# Patient Record
Sex: Female | Born: 1950 | Race: White | Hispanic: No | Marital: Married | Smoking: Former smoker
Health system: Southern US, Community
[De-identification: ages and names within clinical notes are randomized; demographics above are authoritative.]

## PROBLEM LIST (undated history)

## (undated) DIAGNOSIS — E119 Type 2 diabetes mellitus without complications: Secondary | ICD-10-CM

## (undated) DIAGNOSIS — T148XXA Other injury of unspecified body region, initial encounter: Secondary | ICD-10-CM

## (undated) DIAGNOSIS — J3489 Other specified disorders of nose and nasal sinuses: Secondary | ICD-10-CM

## (undated) DIAGNOSIS — Z86010 Personal history of colonic polyps: Secondary | ICD-10-CM

## (undated) DIAGNOSIS — I10 Essential (primary) hypertension: Secondary | ICD-10-CM

## (undated) HISTORY — PX: COLONOSCOPY: SHX174

## (undated) HISTORY — DX: Personal history of colonic polyps: Z86.010

## (undated) HISTORY — PX: SHOULDER SURGERY: SHX246

---

## 1998-04-29 ENCOUNTER — Ambulatory Visit (HOSPITAL_COMMUNITY): Admission: RE | Admit: 1998-04-29 | Discharge: 1998-04-29 | Payer: Self-pay | Admitting: Endocrinology

## 2000-09-19 ENCOUNTER — Encounter: Admission: RE | Admit: 2000-09-19 | Discharge: 2000-12-18 | Payer: Self-pay | Admitting: Internal Medicine

## 2006-05-08 ENCOUNTER — Ambulatory Visit: Payer: Self-pay | Admitting: Internal Medicine

## 2006-05-23 ENCOUNTER — Ambulatory Visit: Payer: Self-pay | Admitting: Internal Medicine

## 2013-06-10 ENCOUNTER — Emergency Department (HOSPITAL_COMMUNITY): Payer: Managed Care, Other (non HMO)

## 2013-06-10 ENCOUNTER — Encounter (HOSPITAL_COMMUNITY): Payer: Self-pay | Admitting: *Deleted

## 2013-06-10 ENCOUNTER — Emergency Department (HOSPITAL_COMMUNITY)
Admission: EM | Admit: 2013-06-10 | Discharge: 2013-06-11 | Disposition: A | Discharge status: Home / Self Care | Payer: Managed Care, Other (non HMO) | Attending: Emergency Medicine | Admitting: Emergency Medicine

## 2013-06-10 DIAGNOSIS — Y9389 Activity, other specified: Secondary | ICD-10-CM | POA: Insufficient documentation

## 2013-06-10 DIAGNOSIS — S42209A Unspecified fracture of upper end of unspecified humerus, initial encounter for closed fracture: Secondary | ICD-10-CM | POA: Insufficient documentation

## 2013-06-10 DIAGNOSIS — I10 Essential (primary) hypertension: Secondary | ICD-10-CM | POA: Insufficient documentation

## 2013-06-10 DIAGNOSIS — Z87891 Personal history of nicotine dependence: Secondary | ICD-10-CM | POA: Insufficient documentation

## 2013-06-10 DIAGNOSIS — Z8639 Personal history of other endocrine, nutritional and metabolic disease: Secondary | ICD-10-CM | POA: Insufficient documentation

## 2013-06-10 DIAGNOSIS — S42202A Unspecified fracture of upper end of left humerus, initial encounter for closed fracture: Secondary | ICD-10-CM

## 2013-06-10 DIAGNOSIS — Z862 Personal history of diseases of the blood and blood-forming organs and certain disorders involving the immune mechanism: Secondary | ICD-10-CM | POA: Insufficient documentation

## 2013-06-10 DIAGNOSIS — W010XXA Fall on same level from slipping, tripping and stumbling without subsequent striking against object, initial encounter: Secondary | ICD-10-CM | POA: Insufficient documentation

## 2013-06-10 DIAGNOSIS — Y929 Unspecified place or not applicable: Secondary | ICD-10-CM | POA: Insufficient documentation

## 2013-06-10 DIAGNOSIS — E119 Type 2 diabetes mellitus without complications: Secondary | ICD-10-CM | POA: Insufficient documentation

## 2013-06-10 HISTORY — DX: Type 2 diabetes mellitus without complications: E11.9

## 2013-06-10 HISTORY — DX: Essential (primary) hypertension: I10

## 2013-06-10 NOTE — ED Notes (Signed)
Patient transported to X-ray 

## 2013-06-10 NOTE — ED Provider Notes (Signed)
CSN: [id number]     Arrival date & time 06/10/13  2332 History     First MD Initiated Contact with Patient 06/10/13 2334     Chief Complaint  Patient presents with  . Arm Pain    left   (Consider location/radiation/quality/duration/timing/severity/associated sxs/prior Treatment) HPI Comments: Patient presents to the ER by ambulance after a fall. Patient reports that she tripped over the cord of her treadmill, fell to gound. Severe pain in upper arm, improved after fentanyl by EMS. No loss of consiousness. Denies neck and back pain. Admits to drinking vodka tonight.  Patient is a 62 y.o. female presenting with arm pain.  Arm Pain Pertinent negatives include no chest pain, no headaches and no shortness of breath.    Past Medical History  Diagnosis Date  . Diabetes mellitus without complication   . Hypertension   . High cholesterol    History reviewed. No pertinent past surgical history. No family history on file. History  Substance Use Topics  . Smoking status: Former Games developer  . Smokeless tobacco: Never Used  . Alcohol Use: Yes   OB History   Grav Para Term Preterm Abortions TAB SAB Ect Mult Living                 Review of Systems  HENT: Negative for neck pain.   Respiratory: Negative for shortness of breath.   Cardiovascular: Negative for chest pain.  Musculoskeletal: Negative for back pain.  Neurological: Negative for dizziness, syncope and headaches.  All other systems reviewed and are negative.    Allergies  Review of patient's allergies indicates not on file.  Home Medications  No current outpatient prescriptions on file. BP 155/62  Pulse 68  Temp(Src) 97.7 F (36.5 C) (Oral)  Resp 18  SpO2 98% Physical Exam  Constitutional: She is oriented to person, place, and time. She appears well-developed and well-nourished. No distress.  HENT:  Head: Normocephalic and atraumatic.  Right Ear: Hearing normal.  Left Ear: Hearing normal.  Nose: Nose normal.   Mouth/Throat: Oropharynx is clear and moist and mucous membranes are normal.  Eyes: Conjunctivae and EOM are normal. Pupils are equal, round, and reactive to light.  Neck: Normal range of motion. Neck supple. No spinous process tenderness and no muscular tenderness present.  Cardiovascular: Regular rhythm, S1 normal and S2 normal.  Exam reveals no gallop and no friction rub.   No murmur heard. Pulses:      Radial pulses are 1+ on the left side.  Pulmonary/Chest: Effort normal and breath sounds normal. No respiratory distress. She exhibits no tenderness.  Abdominal: Soft. Normal appearance and bowel sounds are normal. There is no hepatosplenomegaly. There is no tenderness. There is no rebound, no guarding, no tenderness at McBurney's point and negative Murphy's sign. No hernia.  Musculoskeletal: Normal range of motion.       Left shoulder: Normal.       Left elbow: Normal.       Left wrist: Normal.       Cervical back: Normal.       Thoracic back: Normal.       Lumbar back: Normal.       Left upper arm: She exhibits tenderness.  Neurological: She is alert and oriented to person, place, and time. She has normal strength. No cranial nerve deficit or sensory deficit. Coordination normal. GCS eye subscore is 4. GCS verbal subscore is 5. GCS motor subscore is 6.  Skin: Skin is warm, dry and intact. No  rash noted. No cyanosis.  Psychiatric: She has a normal mood and affect. Her speech is normal and behavior is normal. Thought content normal.    ED Course   Procedures (including critical care time)  Labs Reviewed - No data to display Ct Head Wo Contrast  06/11/2013   *RADIOLOGY REPORT*  Clinical Data: Fall with head injury.  CT HEAD WITHOUT CONTRAST  Technique:  Contiguous axial images were obtained from the base of the skull through the vertex without contrast.  Comparison: None.  Findings: The ventricles and sulci are symmetrical without significant effacement, displacement, or dilatation. No  mass effect or midline shift. No abnormal extra-axial fluid collections. The grey-white matter junction is distinct. Basal cisterns are not effaced. No acute intracranial hemorrhage. No depressed skull fractures.  Vascular calcifications.  There is near complete opacification of the right maxillary antrum with thickening of maxillary antral walls suggesting chronic disease.  Paranasal sinuses are otherwise not opacified.  Mastoid air cells are not opacified.  IMPRESSION: No acute intracranial abnormalities.  Chronic-appearing changes in the left maxillary antrum.   Original Report Authenticated By: Burman Nieves, M.D.   Dg Humerus Left  06/11/2013   *RADIOLOGY REPORT*  Clinical Data: Left shoulder pain after fall.  LEFT HUMERUS - 2+ VIEW  Comparison: None.  Findings: The study is somewhat technically limited due to nonstandard views.  There appears to be acute displaced fracture of the left humeral neck with varus angulation.  Additional displaced bone fragments may be present.  The mid and distal humerus appear intact.  Degenerative changes at the acromioclavicular joint.  IMPRESSION: Technically limited study.  There is acute fracture with varus displacement of the left humeral neck.  There appear to be an additional displaced fracture fragments superiorly.   Original Report Authenticated By: Burman Nieves, M.D.   Diagnosis: Left proximal humerus fracture  MDM  Patient presents to the ER after a fall. Patient is complaining of left shoulder pain. Patient does not have any obvious deformity, but there is severe tenderness. She has decreased range of motion. X-ray confirms proximal humerus fracture. Patient admits to drinking alcohol tonight, but does not appear intoxicated. There was no loss of consciousness. She does have an abrasion on the left side of her head, however, and therefore CAT scan was performed to rule out endocranial injury. CAT scan shows no injury. Patient is experiencing neck or back  pain. The areas are nontender. No evidence of injury elsewhere. Patient provided analgesia the ER and will be discharged with analgesia, followup with orthopedics.  Gilda Crease, MD 06/11/13 [phone]

## 2013-06-10 NOTE — ED Notes (Signed)
Per EMS pt tripped over tread mill cord and fell today on L arm, complaining of L arm pain, limited range of motion, R hand 18G 50 mcg fentanyl given, ETOH on board, shots of vodka, BP 114/88, HR 76, RR 16, pt ambulated to restroom when arrived to ED w/o difficulty.

## 2013-06-10 NOTE — ED Notes (Signed)
Bed: WA08 Expected date:  Expected time:  Means of arrival:  Comments: EMS 

## 2013-06-11 MED ORDER — MORPHINE SULFATE 4 MG/ML IJ SOLN
4.0000 mg | Freq: Once | INTRAMUSCULAR | Status: AC
  Administered 2013-06-11: 4 mg via INTRAVENOUS
  Filled 2013-06-11: qty 1

## 2013-06-11 MED ORDER — ONDANSETRON HCL 4 MG/2ML IJ SOLN
4.0000 mg | Freq: Once | INTRAMUSCULAR | Status: AC
  Administered 2013-06-11: 4 mg via INTRAVENOUS
  Filled 2013-06-11: qty 2

## 2013-06-11 MED ORDER — OXYCODONE-ACETAMINOPHEN 5-325 MG PO TABS: 1.0000 | ORAL_TABLET | ORAL | Status: DC | PRN

## 2013-06-17 ENCOUNTER — Other Ambulatory Visit (HOSPITAL_COMMUNITY): Payer: Self-pay | Admitting: Orthopaedic Surgery

## 2013-06-18 ENCOUNTER — Encounter (HOSPITAL_COMMUNITY): Payer: Self-pay | Admitting: Pharmacy Technician

## 2013-06-19 ENCOUNTER — Encounter (HOSPITAL_COMMUNITY): Payer: Self-pay

## 2013-06-19 ENCOUNTER — Encounter (HOSPITAL_COMMUNITY)
Admission: RE | Admit: 2013-06-19 | Discharge: 2013-06-19 | Disposition: A | Discharge status: Home / Self Care | Payer: Managed Care, Other (non HMO) | Source: Ambulatory Visit | Attending: Orthopaedic Surgery | Admitting: Orthopaedic Surgery

## 2013-06-19 ENCOUNTER — Ambulatory Visit (HOSPITAL_COMMUNITY)
Admission: RE | Admit: 2013-06-19 | Discharge: 2013-06-19 | Disposition: A | Discharge status: Home / Self Care | Payer: Managed Care, Other (non HMO) | Source: Ambulatory Visit | Attending: Anesthesiology | Admitting: Anesthesiology

## 2013-06-19 DIAGNOSIS — X58XXXA Exposure to other specified factors, initial encounter: Secondary | ICD-10-CM | POA: Insufficient documentation

## 2013-06-19 DIAGNOSIS — S42209A Unspecified fracture of upper end of unspecified humerus, initial encounter for closed fracture: Secondary | ICD-10-CM | POA: Insufficient documentation

## 2013-06-19 DIAGNOSIS — I10 Essential (primary) hypertension: Secondary | ICD-10-CM | POA: Insufficient documentation

## 2013-06-19 DIAGNOSIS — Z01812 Encounter for preprocedural laboratory examination: Secondary | ICD-10-CM | POA: Insufficient documentation

## 2013-06-19 DIAGNOSIS — E119 Type 2 diabetes mellitus without complications: Secondary | ICD-10-CM | POA: Insufficient documentation

## 2013-06-19 DIAGNOSIS — Z01818 Encounter for other preprocedural examination: Secondary | ICD-10-CM | POA: Insufficient documentation

## 2013-06-19 DIAGNOSIS — Z01810 Encounter for preprocedural cardiovascular examination: Secondary | ICD-10-CM | POA: Insufficient documentation

## 2013-06-19 DIAGNOSIS — I700 Atherosclerosis of aorta: Secondary | ICD-10-CM | POA: Insufficient documentation

## 2013-06-19 HISTORY — DX: Other specified disorders of nose and nasal sinuses: J34.89

## 2013-06-19 HISTORY — DX: Other injury of unspecified body region, initial encounter: T14.8XXA

## 2013-06-19 LAB — CBC
HCT: 39.1 % (ref 36.0–46.0)
MCH: 33.6 pg (ref 26.0–34.0)
MCHC: 35 g/dL (ref 30.0–36.0)
MCV: 95.8 fL (ref 78.0–100.0)
Platelets: 268 10*3/uL (ref 150–400)
RDW: 12.1 % (ref 11.5–15.5)
WBC: 7.3 10*3/uL (ref 4.0–10.5)

## 2013-06-19 LAB — BASIC METABOLIC PANEL
Creatinine, Ser: 0.65 mg/dL (ref 0.50–1.10)
GFR calc Af Amer: >90 mL/min (ref >90)
Sodium: 139 mEq/L (ref 135–145)

## 2013-06-19 MED ORDER — CEFAZOLIN SODIUM-DEXTROSE 2-3 GM-% IV SOLR
2.0000 g | INTRAVENOUS | Status: AC
  Administered 2013-06-20: 2 g via INTRAVENOUS
  Filled 2013-06-19: qty 50

## 2013-06-19 NOTE — Pre-Procedure Instructions (Signed)
[name]  06/19/2013   Your procedure is scheduled on:  Thurs, Aug 28 @ 8:30 AM  Report to [facility] at 6:30 AM.  Call this number if you have problems the morning of surgery: [phone]   Remember:   Do not eat food or drink liquids after midnight.   Take these medicines the morning of surgery with A SIP OF WATER: Carvedilol(Coreg) and Pain Pill(if needed)              Stop taking your Ibuprofen.No Goody's,BC's,Aleve,Aspirin,Fish Oil,or any Herbal Medications   Do not wear jewelry, make-up or nail polish.  Do not wear lotions, powders, or perfumes. You may wear deodorant.  Do not shave 48 hours prior to surgery.   Do not bring valuables to the hospital.  [facility] is not responsible                   for any belongings or valuables.  Contacts, dentures or bridgework may not be worn into surgery.  Leave suitcase in the car. After surgery it may be brought to your room.  For patients admitted to the hospital, checkout time is 11:00 AM the day of  discharge.   Patients discharged the day of surgery will not be allowed to drive  home.    Special Instructions: Shower using CHG 2 nights before surgery and the night before surgery.  If you shower the day of surgery use CHG.  Use special wash - you have one bottle of CHG for all showers.  You should use approximately 1/3 of the bottle for each shower.   Please read over the following fact sheets that you were given: Pain Booklet, Coughing and Deep Breathing and Surgical Site Infection Prevention

## 2013-06-19 NOTE — Progress Notes (Signed)
06/19/13 1432  OBSTRUCTIVE SLEEP APNEA  Have you ever been diagnosed with sleep apnea through a sleep study? No  Do you snore loudly (loud enough to be heard through closed doors)?  0  Do you often feel tired, fatigued, or sleepy during the daytime? 0  Has anyone observed you stop breathing during your sleep? 0  Do you have, or are you being treated for high blood pressure? 1  BMI more than 35 kg/m2? 1  Age over 63 years old? 1  Neck circumference greater than 40 cm/18 inches? 1  Gender: 0  Obstructive Sleep Apnea Score 4

## 2013-06-19 NOTE — Progress Notes (Signed)
Pt doesn't have a cardiologist  Stress test done at least 43yrs ago  Denies ever having a heart cath or echo  Dr.Daniel Jarold Motto is Medical Md and Endocrinologist  Denies EKG or CXR in past yr

## 2013-06-20 ENCOUNTER — Ambulatory Visit (HOSPITAL_COMMUNITY): Payer: Managed Care, Other (non HMO) | Admitting: Anesthesiology

## 2013-06-20 ENCOUNTER — Other Ambulatory Visit: Payer: Self-pay | Admitting: Physician Assistant

## 2013-06-20 ENCOUNTER — Encounter (HOSPITAL_COMMUNITY)
Admission: RE | Disposition: A | Discharge status: Home / Self Care | Payer: Self-pay | Source: Ambulatory Visit | Attending: Orthopaedic Surgery

## 2013-06-20 ENCOUNTER — Ambulatory Visit (HOSPITAL_COMMUNITY): Payer: Managed Care, Other (non HMO)

## 2013-06-20 ENCOUNTER — Encounter (HOSPITAL_COMMUNITY): Payer: Self-pay | Admitting: Anesthesiology

## 2013-06-20 ENCOUNTER — Observation Stay (HOSPITAL_COMMUNITY)
Admission: RE | Admit: 2013-06-20 | Discharge: 2013-06-21 | Disposition: A | Discharge status: Home / Self Care | Payer: Managed Care, Other (non HMO) | Source: Ambulatory Visit | Attending: Orthopaedic Surgery | Admitting: Orthopaedic Surgery

## 2013-06-20 DIAGNOSIS — Y998 Other external cause status: Secondary | ICD-10-CM | POA: Insufficient documentation

## 2013-06-20 DIAGNOSIS — S42202A Unspecified fracture of upper end of left humerus, initial encounter for closed fracture: Secondary | ICD-10-CM

## 2013-06-20 DIAGNOSIS — W010XXA Fall on same level from slipping, tripping and stumbling without subsequent striking against object, initial encounter: Secondary | ICD-10-CM | POA: Insufficient documentation

## 2013-06-20 DIAGNOSIS — Z79899 Other long term (current) drug therapy: Secondary | ICD-10-CM | POA: Insufficient documentation

## 2013-06-20 DIAGNOSIS — E119 Type 2 diabetes mellitus without complications: Secondary | ICD-10-CM | POA: Insufficient documentation

## 2013-06-20 DIAGNOSIS — I10 Essential (primary) hypertension: Secondary | ICD-10-CM | POA: Insufficient documentation

## 2013-06-20 DIAGNOSIS — E7800 Pure hypercholesterolemia, unspecified: Secondary | ICD-10-CM | POA: Insufficient documentation

## 2013-06-20 DIAGNOSIS — S42213A Unspecified displaced fracture of surgical neck of unspecified humerus, initial encounter for closed fracture: Principal | ICD-10-CM | POA: Insufficient documentation

## 2013-06-20 DIAGNOSIS — Y92009 Unspecified place in unspecified non-institutional (private) residence as the place of occurrence of the external cause: Secondary | ICD-10-CM | POA: Insufficient documentation

## 2013-06-20 HISTORY — PX: ORIF HUMERUS FRACTURE: SHX2126

## 2013-06-20 LAB — GLUCOSE, CAPILLARY
Glucose-Capillary: 155 mg/dL — ABNORMAL HIGH (ref 70–99)
Glucose-Capillary: 276 mg/dL — ABNORMAL HIGH (ref 70–99)

## 2013-06-20 SURGERY — OPEN REDUCTION INTERNAL FIXATION (ORIF) PROXIMAL HUMERUS FRACTURE
Anesthesia: General | Site: Arm Upper | Laterality: Left | Wound class: Clean

## 2013-06-20 MED ORDER — METHOCARBAMOL 500 MG PO TABS
500.0000 mg | ORAL_TABLET | Freq: Four times a day (QID) | ORAL | Status: DC | PRN
  Administered 2013-06-21 (×2): 500 mg via ORAL
  Filled 2013-06-20 (×2): qty 1

## 2013-06-20 MED ORDER — FENTANYL CITRATE 0.05 MG/ML IJ SOLN
INTRAMUSCULAR | Status: AC
  Administered 2013-06-20: 100 ug via INTRAVENOUS
  Filled 2013-06-20: qty 2

## 2013-06-20 MED ORDER — HYDROCODONE-ACETAMINOPHEN 5-325 MG PO TABS: 1.0000 | ORAL_TABLET | ORAL | Status: AC | PRN

## 2013-06-20 MED ORDER — HYDROCHLOROTHIAZIDE 25 MG PO TABS
25.0000 mg | ORAL_TABLET | Freq: Every day | ORAL | Status: DC
  Filled 2013-06-20 (×2): qty 1

## 2013-06-20 MED ORDER — ROCURONIUM BROMIDE 100 MG/10ML IV SOLN
INTRAVENOUS | Status: DC | PRN
  Administered 2013-06-20: 50 mg via INTRAVENOUS

## 2013-06-20 MED ORDER — METHOCARBAMOL 100 MG/ML IJ SOLN
500.0000 mg | Freq: Four times a day (QID) | INTRAVENOUS | Status: DC | PRN
  Filled 2013-06-20: qty 5

## 2013-06-20 MED ORDER — DOCUSATE SODIUM 100 MG PO CAPS
100.0000 mg | ORAL_CAPSULE | Freq: Two times a day (BID) | ORAL | Status: DC
  Administered 2013-06-20 – 2013-06-21 (×2): 100 mg via ORAL
  Filled 2013-06-20 (×4): qty 1

## 2013-06-20 MED ORDER — BUPIVACAINE-EPINEPHRINE PF 0.5-1:200000 % IJ SOLN
INTRAMUSCULAR | Status: DC | PRN
  Administered 2013-06-20: 150 mg

## 2013-06-20 MED ORDER — CANAGLIFLOZIN 300 MG PO TABS
1.0000 | ORAL_TABLET | Freq: Every day | ORAL | Status: DC
  Administered 2013-06-21: 300 mg via ORAL
  Filled 2013-06-20 (×2): qty 1

## 2013-06-20 MED ORDER — POLYETHYLENE GLYCOL 3350 17 G PO PACK: 17.0000 g | PACK | Freq: Every day | ORAL | Status: DC | PRN

## 2013-06-20 MED ORDER — ONDANSETRON HCL 4 MG/2ML IJ SOLN: 4.0000 mg | Freq: Four times a day (QID) | INTRAMUSCULAR | Status: DC | PRN

## 2013-06-20 MED ORDER — ZOLPIDEM TARTRATE 5 MG PO TABS: 5.0000 mg | ORAL_TABLET | Freq: Every evening | ORAL | Status: DC | PRN

## 2013-06-20 MED ORDER — MIDAZOLAM HCL 2 MG/2ML IJ SOLN
2.0000 mg | Freq: Once | INTRAMUSCULAR | Status: AC
  Administered 2013-06-20: 2 mg via INTRAVENOUS

## 2013-06-20 MED ORDER — LACTATED RINGERS IV SOLN
INTRAVENOUS | Status: DC
  Administered 2013-06-20 (×3): via INTRAVENOUS

## 2013-06-20 MED ORDER — FENTANYL CITRATE 0.05 MG/ML IJ SOLN
INTRAMUSCULAR | Status: DC | PRN
  Administered 2013-06-20: 100 ug via INTRAVENOUS
  Administered 2013-06-20: 50 ug via INTRAVENOUS

## 2013-06-20 MED ORDER — BISACODYL 5 MG PO TBEC: 5.0000 mg | DELAYED_RELEASE_TABLET | Freq: Every day | ORAL | Status: DC | PRN

## 2013-06-20 MED ORDER — GLIMEPIRIDE 4 MG PO TABS
4.0000 mg | ORAL_TABLET | Freq: Every day | ORAL | Status: DC
  Administered 2013-06-21: 4 mg via ORAL
  Filled 2013-06-20 (×2): qty 1

## 2013-06-20 MED ORDER — OXYCODONE HCL 5 MG/5ML PO SOLN: 5.0000 mg | Freq: Once | ORAL | Status: DC | PRN

## 2013-06-20 MED ORDER — HYDROCODONE-ACETAMINOPHEN 5-325 MG PO TABS: 1.0000 | ORAL_TABLET | ORAL | Status: DC | PRN

## 2013-06-20 MED ORDER — MIDAZOLAM HCL 2 MG/2ML IJ SOLN
INTRAMUSCULAR | Status: AC
  Administered 2013-06-20: 2 mg via INTRAVENOUS
  Filled 2013-06-20: qty 2

## 2013-06-20 MED ORDER — ONDANSETRON HCL 4 MG/2ML IJ SOLN
INTRAMUSCULAR | Status: DC | PRN
  Administered 2013-06-20: 4 mg via INTRAVENOUS

## 2013-06-20 MED ORDER — EPHEDRINE SULFATE 50 MG/ML IJ SOLN
INTRAMUSCULAR | Status: DC | PRN
  Administered 2013-06-20 (×2): 10 mg via INTRAVENOUS

## 2013-06-20 MED ORDER — METFORMIN HCL ER 500 MG PO TB24
500.0000 mg | ORAL_TABLET | Freq: Two times a day (BID) | ORAL | Status: DC
  Administered 2013-06-20 – 2013-06-21 (×2): 500 mg via ORAL
  Filled 2013-06-20 (×4): qty 1

## 2013-06-20 MED ORDER — NEOSTIGMINE METHYLSULFATE 1 MG/ML IJ SOLN
INTRAMUSCULAR | Status: DC | PRN
  Administered 2013-06-20: 3 mg via INTRAVENOUS

## 2013-06-20 MED ORDER — PROMETHAZINE HCL 25 MG/ML IJ SOLN: 6.2500 mg | INTRAMUSCULAR | Status: DC | PRN

## 2013-06-20 MED ORDER — 0.9 % SODIUM CHLORIDE (POUR BTL) OPTIME
TOPICAL | Status: DC | PRN
  Administered 2013-06-20: 1000 mL

## 2013-06-20 MED ORDER — DEXAMETHASONE SODIUM PHOSPHATE 4 MG/ML IJ SOLN
INTRAMUSCULAR | Status: DC | PRN
  Administered 2013-06-20: 4 mg

## 2013-06-20 MED ORDER — PROPOFOL 10 MG/ML IV BOLUS
INTRAVENOUS | Status: DC | PRN
  Administered 2013-06-20: 200 mg via INTRAVENOUS

## 2013-06-20 MED ORDER — FENTANYL CITRATE 0.05 MG/ML IJ SOLN
100.0000 ug | Freq: Once | INTRAMUSCULAR | Status: AC
  Administered 2013-06-20: 100 ug via INTRAVENOUS

## 2013-06-20 MED ORDER — SODIUM CHLORIDE 0.9 % IV SOLN: INTRAVENOUS | Status: DC

## 2013-06-20 MED ORDER — CEFAZOLIN SODIUM 1-5 GM-% IV SOLN
1.0000 g | Freq: Four times a day (QID) | INTRAVENOUS | Status: DC
  Administered 2013-06-20 – 2013-06-21 (×2): 1 g via INTRAVENOUS
  Filled 2013-06-20 (×3): qty 50

## 2013-06-20 MED ORDER — OXYCODONE HCL 5 MG PO TABS: 5.0000 mg | ORAL_TABLET | Freq: Once | ORAL | Status: DC | PRN

## 2013-06-20 MED ORDER — GLYCOPYRROLATE 0.2 MG/ML IJ SOLN
INTRAMUSCULAR | Status: DC | PRN
  Administered 2013-06-20: 0.6 mg via INTRAVENOUS

## 2013-06-20 MED ORDER — METOCLOPRAMIDE HCL 10 MG PO TABS: 5.0000 mg | ORAL_TABLET | Freq: Three times a day (TID) | ORAL | Status: DC | PRN

## 2013-06-20 MED ORDER — SIMVASTATIN 20 MG PO TABS
20.0000 mg | ORAL_TABLET | ORAL | Status: DC
Start: 2013-06-21 — End: 2013-06-21
  Filled 2013-06-20: qty 1

## 2013-06-20 MED ORDER — RAMIPRIL 10 MG PO CAPS
10.0000 mg | ORAL_CAPSULE | Freq: Every day | ORAL | Status: DC
  Administered 2013-06-20: 10 mg via ORAL
  Filled 2013-06-20 (×2): qty 1

## 2013-06-20 MED ORDER — CARVEDILOL 12.5 MG PO TABS
12.5000 mg | ORAL_TABLET | Freq: Two times a day (BID) | ORAL | Status: DC
  Administered 2013-06-20 – 2013-06-21 (×2): 12.5 mg via ORAL
  Filled 2013-06-20 (×4): qty 1

## 2013-06-20 MED ORDER — OXYCODONE HCL 5 MG PO TABS
5.0000 mg | ORAL_TABLET | ORAL | Status: DC | PRN
  Administered 2013-06-21 (×3): 10 mg via ORAL
  Filled 2013-06-20 (×3): qty 2

## 2013-06-20 MED ORDER — MORPHINE SULFATE 2 MG/ML IJ SOLN: 1.0000 mg | INTRAMUSCULAR | Status: DC | PRN

## 2013-06-20 MED ORDER — METHOCARBAMOL 500 MG PO TABS: 500.0000 mg | ORAL_TABLET | Freq: Three times a day (TID) | ORAL | Status: AC

## 2013-06-20 MED ORDER — DIPHENHYDRAMINE HCL 12.5 MG/5ML PO ELIX: 12.5000 mg | ORAL_SOLUTION | ORAL | Status: DC | PRN

## 2013-06-20 MED ORDER — METOCLOPRAMIDE HCL 5 MG/ML IJ SOLN: 5.0000 mg | Freq: Three times a day (TID) | INTRAMUSCULAR | Status: DC | PRN

## 2013-06-20 MED ORDER — ONDANSETRON HCL 4 MG PO TABS: 4.0000 mg | ORAL_TABLET | Freq: Four times a day (QID) | ORAL | Status: DC | PRN

## 2013-06-20 MED ORDER — HYDROMORPHONE HCL PF 1 MG/ML IJ SOLN: 0.2500 mg | INTRAMUSCULAR | Status: DC | PRN

## 2013-06-20 SURGICAL SUPPLY — 71 items
ADH SKN CLS APL DERMABOND .7 (GAUZE/BANDAGES/DRESSINGS) ×1
BIT DRILL 2.8X4 QC CORT (BIT) ×1 IMPLANT
BIT DRILL 4 LONG FAST STEP (BIT) ×1 IMPLANT
BIT DRILL 4 SHORT FAST STEP (BIT) ×1 IMPLANT
BONE CANC CHIPS 40CC CAN1/2 (Bone Implant) ×2 IMPLANT
CANISTER SUCTION 2500CC (MISCELLANEOUS) ×1 IMPLANT
CHIPS CANC BONE 40CC CAN1/2 (Bone Implant) ×1 IMPLANT
CLOTH BEACON ORANGE TIMEOUT ST (SAFETY) ×2 IMPLANT
DERMABOND ADVANCED (GAUZE/BANDAGES/DRESSINGS) ×1
DERMABOND ADVANCED .7 DNX12 (GAUZE/BANDAGES/DRESSINGS) IMPLANT
DRAPE C-ARM 42X72 X-RAY (DRAPES) ×2 IMPLANT
DRAPE INCISE IOBAN 66X45 STRL (DRAPES) ×2 IMPLANT
DRAPE U-SHAPE 47X51 STRL (DRAPES) ×2 IMPLANT
DRSG AQUACEL AG ADV 3.5X10 (GAUZE/BANDAGES/DRESSINGS) ×1 IMPLANT
DRSG PAD ABDOMINAL 8X10 ST (GAUZE/BANDAGES/DRESSINGS) ×2 IMPLANT
ELECT REM PT RETURN 9FT ADLT (ELECTROSURGICAL) ×2
ELECTRODE REM PT RTRN 9FT ADLT (ELECTROSURGICAL) ×1 IMPLANT
GAUZE XEROFORM 1X8 LF (GAUZE/BANDAGES/DRESSINGS) ×1 IMPLANT
GLOVE BIO SURGEON STRL SZ8 (GLOVE) ×2 IMPLANT
GLOVE BIOGEL PI IND STRL 6.5 (GLOVE) IMPLANT
GLOVE BIOGEL PI IND STRL 8 (GLOVE) ×2 IMPLANT
GLOVE BIOGEL PI INDICATOR 6.5 (GLOVE) ×1
GLOVE BIOGEL PI INDICATOR 8 (GLOVE) ×3
GLOVE ECLIPSE 6.5 STRL STRAW (GLOVE) ×2 IMPLANT
GLOVE ORTHO TXT STRL SZ7.5 (GLOVE) ×3 IMPLANT
GOWN PREVENTION PLUS LG XLONG (DISPOSABLE) ×1 IMPLANT
GOWN PREVENTION PLUS XLARGE (GOWN DISPOSABLE) ×4 IMPLANT
GOWN STRL NON-REIN LRG LVL3 (GOWN DISPOSABLE) ×2 IMPLANT
GRAFT BNE CHIP CANC 1-8 40 (Bone Implant) IMPLANT
KIT BASIN OR (CUSTOM PROCEDURE TRAY) ×2 IMPLANT
KIT ROOM TURNOVER OR (KITS) ×2 IMPLANT
MANIFOLD NEPTUNE II (INSTRUMENTS) ×2 IMPLANT
NEEDLE 22X1 1/2 (OR ONLY) (NEEDLE) ×1 IMPLANT
NS IRRIG 1000ML POUR BTL (IV SOLUTION) ×2 IMPLANT
PACK SHOULDER (CUSTOM PROCEDURE TRAY) ×2 IMPLANT
PAD ARMBOARD 7.5X6 YLW CONV (MISCELLANEOUS) ×4 IMPLANT
PEG STND 4.0X32.5MM (Orthopedic Implant) ×2 IMPLANT
PEG STND 4.0X37.5MM (Orthopedic Implant) ×2 IMPLANT
PEG STND 4.0X40MM (Orthopedic Implant) ×2 IMPLANT
PEG STND 4.0X42.5MM (Orthopedic Implant) ×4 IMPLANT
PEG STND 4.0X45.0MM (Orthopedic Implant) ×4 IMPLANT
PEGSTD 4.0X32.5MM (Orthopedic Implant) IMPLANT
PEGSTD 4.0X37.5MM (Orthopedic Implant) IMPLANT
PEGSTD 4.0X40MM (Orthopedic Implant) IMPLANT
PEGSTD 4.0X42.5MM (Orthopedic Implant) IMPLANT
PEGSTD 4.0X45.0MM (Orthopedic Implant) IMPLANT
PIN GUIDE SHOULDER 2.0MM (PIN) ×3 IMPLANT
PLATE SHOULDER S3 4HOLE LT (Plate) ×1 IMPLANT
SCREW MULTIDIR 3.8X30 HUMRL (Screw) ×1 IMPLANT
SCREW MULTIDIR 3.8X32 HUMRL (Screw) ×1 IMPLANT
SCREW MULTIDIR SS 3.8X28 HUMRL (Screw) ×1 IMPLANT
SLING ARM FOAM STRAP LRG (SOFTGOODS) ×1 IMPLANT
SPONGE GAUZE 4X4 12PLY (GAUZE/BANDAGES/DRESSINGS) ×1 IMPLANT
SPONGE LAP 4X18 X RAY DECT (DISPOSABLE) ×3 IMPLANT
STAPLER VISISTAT 35W (STAPLE) ×2 IMPLANT
SUCTION FRAZIER TIP 10 FR DISP (SUCTIONS) ×2 IMPLANT
SUT FIBERWIRE #2 38 T-5 BLUE (SUTURE) ×2
SUT MNCRL AB 4-0 PS2 18 (SUTURE) ×1 IMPLANT
SUT VIC AB 0 CT1 27 (SUTURE) ×4
SUT VIC AB 0 CT1 27XBRD ANBCTR (SUTURE) ×2 IMPLANT
SUT VIC AB 1 CT1 27 (SUTURE) ×2
SUT VIC AB 1 CT1 27XBRD ANBCTR (SUTURE) IMPLANT
SUT VIC AB 2-0 CT1 27 (SUTURE) ×4
SUT VIC AB 2-0 CT1 TAPERPNT 27 (SUTURE) ×2 IMPLANT
SUTURE FIBERWR #2 38 T-5 BLUE (SUTURE) IMPLANT
SYR CONTROL 10ML LL (SYRINGE) ×1 IMPLANT
TOWEL OR 17X24 6PK STRL BLUE (TOWEL DISPOSABLE) ×2 IMPLANT
TOWEL OR 17X26 10 PK STRL BLUE (TOWEL DISPOSABLE) ×2 IMPLANT
TUBE CONNECTING 12X1/4 (SUCTIONS) ×1 IMPLANT
WATER STERILE IRR 1000ML POUR (IV SOLUTION) ×1 IMPLANT
YANKAUER SUCT BULB TIP NO VENT (SUCTIONS) ×3 IMPLANT

## 2013-06-20 NOTE — Anesthesia Preprocedure Evaluation (Signed)
Anesthesia Evaluation  Patient identified by MRN, date of birth, ID band Patient awake    Reviewed: Allergy & Precautions, H&P , NPO status , Patient's Chart, lab work & pertinent test results  History of Anesthesia Complications Negative for: history of anesthetic complications  Airway Mallampati: II TM Distance: >3 FB Neck ROM: Full    Dental  (+) Teeth Intact and Dental Advisory Given   Pulmonary neg pulmonary ROS, former smoker,    Pulmonary exam normal       Cardiovascular hypertension, Pt. on medications     Neuro/Psych negative neurological ROS  negative psych ROS   GI/Hepatic negative GI ROS, Neg liver ROS,   Endo/Other  diabetes  Renal/GU negative Renal ROS     Musculoskeletal   Abdominal   Peds  Hematology   Anesthesia Other Findings   Reproductive/Obstetrics                           Anesthesia Physical Anesthesia Plan  ASA: III  Anesthesia Plan: General   Post-op Pain Management:    Induction: Intravenous  Airway Management Planned: Oral ETT  Additional Equipment:   Intra-op Plan:   Post-operative Plan: Extubation in OR  Informed Consent: I have reviewed the patients History and Physical, chart, labs and discussed the procedure including the risks, benefits and alternatives for the proposed anesthesia with the patient or authorized representative who has indicated his/her understanding and acceptance.   Dental advisory given  Plan Discussed with: CRNA, Anesthesiologist and Surgeon  Anesthesia Plan Comments:         Anesthesia Quick Evaluation

## 2013-06-20 NOTE — Progress Notes (Signed)
assited pt up to br voided qs

## 2013-06-20 NOTE — H&P (Signed)
[name] is an 62 y.o. female.   Chief Complaint:   Left shoulder pain; known proximal humerus fracture HPI:   62 yo female who sustained a severely displaced left proximal humerus frature from a mechanical fall last week.  It has been recommended that she undergo ORIF of this fracture due to its severe displacement and rotation.  She understands this fully as well as the risks involved with surgery.  Past Medical History  Diagnosis Date  . High cholesterol     takes Zocor every other day  . Bruise     on left arm from break  . Hypertension     takes Ramipril,HCTZ, and COreg daily  . Diabetes mellitus without complication     takes Amaryl,Metformin,and Invokana daily  . Sinus drainage     Past Surgical History  Procedure Laterality Date  . Colonoscopy    . No past surgeries      History reviewed. No pertinent family history. Social History:  reports that she has quit smoking. She has never used smokeless tobacco. She reports that  drinks alcohol. She reports that she does not use illicit drugs.  Allergies:  Allergies  Allergen Reactions  . Sulfa Antibiotics Other (See Comments)    Yeast infections     Medications Prior to Admission  Medication Sig Dispense Refill  . Canagliflozin (INVOKANA) 300 MG TABS Take 1 tablet by mouth daily.      . carvedilol (COREG) 12.5 MG tablet Take 12.5 mg by mouth 2 (two) times daily.      Marland Kitchen glimepiride (AMARYL) 4 MG tablet Take 4 mg by mouth daily before breakfast.      . hydrochlorothiazide (HYDRODIURIL) 25 MG tablet Take 25 mg by mouth daily.      Marland Kitchen ibuprofen (ADVIL,MOTRIN) 200 MG tablet Take 200 mg by mouth every 8 (eight) hours as needed for pain.      . metFORMIN (GLUCOPHAGE-XR) 500 MG 24 hr tablet Take 500 mg by mouth 2 (two) times daily.      Marland Kitchen oxyCODONE-acetaminophen (PERCOCET/ROXICET) 5-325 MG per tablet Take 1 tablet by mouth every 4 (four) hours as needed for pain.      . ramipril (ALTACE) 10 MG capsule Take 10 mg by mouth  daily.      . simvastatin (ZOCOR) 20 MG tablet Take 20 mg by mouth every other day.         Results for orders placed during the hospital encounter of 06/20/13 (from the past 48 hour(s))  GLUCOSE, CAPILLARY     Status: Abnormal   Collection Time    06/20/13  7:04 AM      Result Value Range   Glucose-Capillary 155 (*) 70 - 99 mg/dL   Dg Chest 2 View  6/96/2952   *RADIOLOGY REPORT*  Clinical Data: Preoperative evaluation, left humeral fracture, history hypertension, diabetes, smoking  CHEST - 2 VIEW  Comparison: None  Findings: Upper-normal size of cardiac silhouette. Atherosclerotic calcification aorta. Mediastinal contours and pulmonary vascularity normal. Subsegmental atelectasis at medial left lung base. Remaining lungs clear. No pleural effusion or pneumothorax. Comminuted displaced fracture-dislocation of the proximal left humerus.  IMPRESSION: Subsegmental atelectasis medial left lower lobe. Comminuted displaced fracture-dislocation of the proximal left humerus.   Original Report Authenticated By: Ulyses Southward, M.D.    Review of Systems  All other systems reviewed and are negative.    Blood pressure 146/66, pulse 69, resp. rate 16, SpO2 99.00%. Physical Exam  Constitutional: She is oriented to person, place,  and time. She appears well-developed and well-nourished.  HENT:  Head: Normocephalic and atraumatic.  Eyes: EOM are normal. Pupils are equal, round, and reactive to light.  Neck: Normal range of motion. Neck supple.  Cardiovascular: Normal rate and regular rhythm.   Respiratory: Effort normal and breath sounds normal.  GI: Soft. Bowel sounds are normal.  Musculoskeletal:       Left shoulder: She exhibits decreased range of motion, bony tenderness, swelling, deformity and pain.  Neurological: She is alert and oriented to person, place, and time.  Skin: Skin is warm and dry.  Psychiatric: She has a normal mood and affect.     Assessment/Plan Significantly displaced left 3  - 4 part proximal humerus fracture 1)  To the OR today for open reduction/internal fixation of her left proximal humerus fracture then admission for overnight observation  [name] Y 06/20/2013, 8:25 AM

## 2013-06-20 NOTE — Anesthesia Procedure Notes (Addendum)
Anesthesia Regional Block:  Interscalene brachial plexus block  Pre-Anesthetic Checklist: ,, timeout performed, Correct Patient, Correct Site, Correct Laterality, Correct Procedure, Correct Position, site marked, Risks and benefits discussed,  Surgical consent,  Pre-op evaluation,  At surgeon's request and post-op pain management  Laterality: Right  Prep: chloraprep       Needles:  Injection technique: Single-shot  Needle Type: Echogenic Stimulator Needle     Needle Length: 5cm 5 cm Needle Gauge: 22 and 22 G    Additional Needles:  Procedures: ultrasound guided (picture in chart) and nerve stimulator Interscalene brachial plexus block  Nerve Stimulator or Paresthesia:  Response: bicep contraction, 0.45 mA,   Additional Responses:   Narrative:  Start time: 06/20/2013 8:16 AM End time: 06/20/2013 8:26 AM Injection made incrementally with aspirations every 5 mL.  Performed by: Personally  Anesthesiologist: J. Adonis Huguenin, MD  Additional Notes: Functioning IV was confirmed and monitors applied.  A 50mm 22ga echogenic arrow stimulator was used. Sterile prep and drape,hand hygiene and sterile gloves were used.Ultrasound guidance: relevant anatomy identified, needle position confirmed, local anesthetic spread visualized around nerve(s)., vascular puncture avoided.  Image printed for medical record.  Negative aspiration and negative test dose prior to incremental administration of local anesthetic. The patient tolerated the procedure well.  Interscalene brachial plexus block

## 2013-06-20 NOTE — Transfer of Care (Signed)
Immediate Anesthesia Transfer of Care Note  Patient: [name]  Procedure(s) Performed: Procedure(s): OPEN REDUCTION INTERNAL FIXATION (ORIF) LEFT PROXIMAL HUMERUS FRACTURE (Left)  Patient Location: PACU  Anesthesia Type:General and Regional  Level of Consciousness: awake, alert , oriented and patient cooperative  Airway & Oxygen Therapy: Patient Spontanous Breathing and Patient connected to nasal cannula oxygen  Post-op Assessment: Report given to PACU RN, Post -op Vital signs reviewed and stable and Patient moving all extremities  Post vital signs: Reviewed and stable  Complications: No apparent anesthesia complications

## 2013-06-20 NOTE — Op Note (Signed)
[name], [name] NO.:  [id number]  MEDICAL RECORD NO.:  [id number]  LOCATION:  [id number]                        FACILITY:  [facility]  PHYSICIAN:  Vanita Panda. Magnus Ivan, M.D.DATE OF BIRTH:  02-Aug-1951  DATE OF PROCEDURE:  06/20/2013 DATE OF DISCHARGE:                              OPERATIVE REPORT   PREOPERATIVE DIAGNOSIS:  Left shoulder 3-4 part displaced proximal humerus fracture.  POSTOPERATIVE DIAGNOSIS:  Left 4-part proximal humerus fracture.  PROCEDURE:  Open reduction and internal fixation of left 4-part proximal humerus fracture.  IMPLANTS:  [employer] periarticular proximal humeral locking plate.  SURGEON:  Vanita Panda. Magnus Ivan, M.D.  ASSISTANT:  Richardean Canal, P.A.  ANESTHESIA: 1. Regional left shoulder block. 2. General.  BLOOD LOSS:  Less than 100 mL.  COMPLICATIONS:  None.  ANTIBIOTICS:  2 g of IV Ancef.  INDICATIONS:  Ms. [name] is a very pleasant 62 year old female who last week tripped over a cord in her house sustaining a significant fall injuring her left shoulder.  She was seen in the emergency room and found to have a complex proximal humerus fracture.  We then saw her in the office and due to the nature of this fracture, recommended open reduction and internal fixation.  She understands how significant this break is and understands the reasoning for [place] to proceed with open reduction and internal fixation.  PROCEDURE DESCRIPTION:  After informed consent was obtained, the appropriate left shoulder was marked.  Anesthesia was obtained with the regional shoulder block.  She was then brought to the operating room, placed supine on the operating table.  General anesthesia was then obtained.  She was then fashioned in a beach-chair position with appropriate position of the head and neck and padding of the down on operative right arm.  There was bending at the waist and knees and palpable pulses in her feet.  Her left  shoulder was then prepped and draped with DuraPrep and sterile drapes including sterile stockinette. A time-out was called and she was identified as correct patient, correct left shoulder.  I then took a deltopectoral approach to the shoulder dissecting down to the fracture itself.  I cleaned the fracture of debris and then saw the articular surface of the femoral head was spun about 180 degrees.  Once I pointed this back in the direction of the glenoid, I was able to bring the lesser tuberosity and greater tuberosity pieces back around.  I secured these with temporary K-wires. I then chose a periarticular lock and plate from [employer] and fashioned this along the anterolateral cortex of the humerus.  I secured this with bicortical screws distally and multiple locking pegs proximally.  I before bringing all the lesser and greater trochanter pieces back together, I did have to use supplemental bone graft with cancellous chips to fill out void in the humeral head.  I then brought the FiberWire suture down to the greater and lesser trochanter pieces and through the plate to secure these down.  All the reduction was accomplished under direct visualization and direct fluoroscopy.  We then irrigated the soft tissues with normal saline solution.  I closed the deep tissue with 0-Vicryl, 2-0 Vicryl in subcutaneous  tissue, 4-0 Monocryl subcuticular stitch and Dermabond on the skin.  Well-padded sterile dressing was applied.  Arm was placed in a sling.  She was awakened, extubated, and taken to the recovery room in stable condition. All final counts were correct.  There were no complications noted.  Of note, Kriste Basque, PA-C was present in the entire case and his presence was most crucial for positioning the arm, retracting, and closing the wound.     Vanita Panda. Magnus Ivan, M.D.     CYB/MEDQ  D:  06/20/2013  T:  06/20/2013  Job:  [id number]

## 2013-06-20 NOTE — Anesthesia Postprocedure Evaluation (Signed)
Anesthesia Post Note  Patient: [name]  Procedure(s) Performed: Procedure(s) (LRB): OPEN REDUCTION INTERNAL FIXATION (ORIF) LEFT PROXIMAL HUMERUS FRACTURE (Left)  Anesthesia type: general  Patient location: PACU  Post pain: Pain level controlled  Post assessment: Patient's Cardiovascular Status Stable  Last Vitals:  Filed Vitals:   06/20/13 1142  BP: 117/73  Pulse: 57  Temp:   Resp: 17    Post vital signs: Reviewed and stable  Level of consciousness: sedated  Complications: No apparent anesthesia complications

## 2013-06-20 NOTE — Brief Op Note (Signed)
06/20/2013  10:57 AM  PATIENT:  [name]  62 y.o. female  PRE-OPERATIVE DIAGNOSIS:  Left proximal humerus fracture  POST-OPERATIVE DIAGNOSIS:  Left proximal humerus fracture  PROCEDURE:  Procedure(s): OPEN REDUCTION INTERNAL FIXATION (ORIF) LEFT PROXIMAL HUMERUS FRACTURE (Left)  SURGEON:  Surgeon(s) and Role:    * Kathryne Hitch, MD - Primary  PHYSICIAN ASSISTANT: Rexene Edison, PA-C  ANESTHESIA:   regional and general  EBL:  Total I/O In: -  Out: 100 [Blood:100]  BLOOD ADMINISTERED:none  DRAINS: none   LOCAL MEDICATIONS USED:  NONE  SPECIMEN:  No Specimen  DISPOSITION OF SPECIMEN:  N/A  COUNTS:  YES  TOURNIQUET:  * No tourniquets in log *  DICTATION: .Other Dictation: Dictation Number [id number]  PLAN OF CARE: Admit for overnight observation  PATIENT DISPOSITION:  PACU - hemodynamically stable.   Delay start of Pharmacological VTE agent (>24hrs) due to surgical blood loss or risk of bleeding: not applicable

## 2013-06-21 ENCOUNTER — Encounter (HOSPITAL_COMMUNITY): Payer: Self-pay | Admitting: Orthopaedic Surgery

## 2013-06-21 LAB — GLUCOSE, CAPILLARY: Glucose-Capillary: 179 mg/dL — ABNORMAL HIGH (ref 70–99)

## 2013-06-21 NOTE — Discharge Summary (Signed)
Patient ID: [name] MRN: [id number] DOB/AGE: 06-26-1951 62 y.o.  Admit date: 06/20/2013 Discharge date: 06/21/2013  Admission Diagnoses:  Principal Problem:   Closed fracture of left proximal humerus   Discharge Diagnoses:  Same  Past Medical History  Diagnosis Date  . High cholesterol     takes Zocor every other day  . Bruise     on left arm from break  . Hypertension     takes Ramipril,HCTZ, and COreg daily  . Diabetes mellitus without complication     takes Amaryl,Metformin,and Invokana daily  . Sinus drainage     Surgeries: Procedure(s): OPEN REDUCTION INTERNAL FIXATION (ORIF) LEFT PROXIMAL HUMERUS FRACTURE on 06/20/2013   Consultants:    Discharged Condition: Improved  Hospital Course: [name] is an 62 y.o. female who was admitted 06/20/2013 for operative treatment ofClosed fracture of left proximal humerus. Patient has severe unremitting pain that affects sleep, daily activities, and work/hobbies. After pre-op clearance the patient was taken to the operating room on 06/20/2013 and underwent  Procedure(s): OPEN REDUCTION INTERNAL FIXATION (ORIF) LEFT PROXIMAL HUMERUS FRACTURE.    Patient was given perioperative antibiotics: Anti-infectives   Start     Dose/Rate Route Frequency Ordered Stop   06/20/13 1730  ceFAZolin (ANCEF) IVPB 1 g/50 mL premix     1 g 100 mL/hr over 30 Minutes Intravenous Every 6 hours 06/20/13 1628 06/21/13 1129   06/20/13 0600  ceFAZolin (ANCEF) IVPB 2 g/50 mL premix     2 g 100 mL/hr over 30 Minutes Intravenous On call to O.R. 06/19/13 1424 06/20/13 0905       Patient was given sequential compression devices, early ambulation, and chemoprophylaxis to prevent DVT.  Patient benefited maximally from hospital stay and there were no complications.    Recent vital signs: Patient Vitals for the past 24 hrs:  BP Temp Temp src Pulse Resp SpO2  06/21/13 0637 130/65 mmHg 97.6 F (36.4 C) - 72 20 99 %  06/21/13 0215 125/63 mmHg 97.3 F  (36.3 C) Axillary 73 20 98 %  06/20/13 2106 138/63 mmHg 98 F (36.7 C) - 83 20 98 %  06/20/13 1610 142/56 mmHg 98.1 F (36.7 C) Oral 66 18 97 %  06/20/13 1545 146/60 mmHg 98 F (36.7 C) - 89 21 96 %  06/20/13 1442 147/42 mmHg - - 73 18 97 %  06/20/13 1345 - - - 70 16 97 %  06/20/13 1341 137/64 mmHg - - - - -  06/20/13 1315 - - - 63 21 95 %  06/20/13 1311 114/66 mmHg - - - - -  06/20/13 1245 - - - 55 17 98 %  06/20/13 1242 112/77 mmHg - - - - -  06/20/13 1215 - - - 54 16 98 %  06/20/13 1211 139/71 mmHg - - - - -  06/20/13 1142 117/73 mmHg - - 57 17 99 %  06/20/13 1130 - - - 62 17 97 %  06/20/13 1126 145/62 mmHg - - - - -  06/20/13 1115 - - - 69 16 97 %  06/20/13 1110 139/62 mmHg 97.6 F (36.4 C) - 67 18 93 %  06/20/13 0840 138/68 mmHg - - 64 17 98 %  06/20/13 0835 173/68 mmHg - - 67 16 96 %  06/20/13 0830 158/69 mmHg - - 70 13 97 %  06/20/13 0825 154/55 mmHg - - 71 14 93 %  06/20/13 0820 147/50 mmHg - - 73 17 89 %  06/20/13 0815 147/55  mmHg - - 69 15 100 %  06/20/13 0810 146/66 mmHg - - 69 16 99 %  06/20/13 0808 - - - 69 13 100 %  06/20/13 0806 146/60 mmHg - - 66 14 100 %  06/20/13 0801 160/52 mmHg - - 69 14 100 %  06/20/13 0758 - - - 63 13 100 %  06/20/13 0756 - - - 64 14 100 %  06/20/13 0755 155/62 mmHg - - 62 16 100 %  06/20/13 0753 - - - 61 19 100 %  06/20/13 0752 - - - 60 13 100 %  06/20/13 0750 150/58 mmHg - - 58 15 99 %  06/20/13 0749 - - - 61 17 100 %  06/20/13 0747 - - - 60 14 100 %  06/20/13 0745 146/82 mmHg - - 61 16 99 %  06/20/13 0742 - - - 63 17 100 %  06/20/13 0740 145/58 mmHg - - 59 17 99 %  06/20/13 0737 - - - 61 15 99 %  06/20/13 0735 141/62 mmHg - - 61 18 99 %  06/20/13 0732 - - - 60 11 100 %  06/20/13 0730 144/53 mmHg - - 60 18 99 %  06/20/13 0728 - - - 64 15 99 %  06/20/13 0726 - - - 64 19 99 %  06/20/13 0725 128/71 mmHg - - 64 18 99 %  06/20/13 0724 - - - 64 16 99 %  06/20/13 0657 139/62 mmHg - Oral 67 18 98 %     Recent laboratory studies:   Recent Labs  06/19/13 1420  WBC 7.3  HGB 13.7  HCT 39.1  PLT 268  NA 139  K 4.5  CL 103  CO2 26  BUN 14  CREATININE 0.65  GLUCOSE 105*  CALCIUM 9.9     Discharge Medications:     Medication List    STOP taking these medications       ibuprofen 200 MG tablet  Commonly known as:  ADVIL,MOTRIN     oxyCODONE-acetaminophen 5-325 MG per tablet  Commonly known as:  PERCOCET/ROXICET      TAKE these medications       carvedilol 12.5 MG tablet  Commonly known as:  COREG  Take 12.5 mg by mouth 2 (two) times daily.     glimepiride 4 MG tablet  Commonly known as:  AMARYL  Take 4 mg by mouth daily before breakfast.     hydrochlorothiazide 25 MG tablet  Commonly known as:  HYDRODIURIL  Take 25 mg by mouth daily.     HYDROcodone-acetaminophen 5-325 MG per tablet  Commonly known as:  NORCO/VICODIN  Take 1-2 tablets by mouth every 4 (four) hours as needed.     INVOKANA 300 MG Tabs  Generic drug:  Canagliflozin  Take 1 tablet by mouth daily.     metFORMIN 500 MG 24 hr tablet  Commonly known as:  GLUCOPHAGE-XR  Take 500 mg by mouth 2 (two) times daily.     methocarbamol 500 MG tablet  Commonly known as:  ROBAXIN  Take 1 tablet (500 mg total) by mouth 3 (three) times daily.     ramipril 10 MG capsule  Commonly known as:  ALTACE  Take 10 mg by mouth daily.     simvastatin 20 MG tablet  Commonly known as:  ZOCOR  Take 20 mg by mouth every other day.        Diagnostic Studies: Dg Chest 2 View  06/19/2013   *RADIOLOGY REPORT*  Clinical Data: Preoperative evaluation, left humeral fracture, history hypertension, diabetes, smoking  CHEST - 2 VIEW  Comparison: None  Findings: Upper-normal size of cardiac silhouette. Atherosclerotic calcification aorta. Mediastinal contours and pulmonary vascularity normal. Subsegmental atelectasis at medial left lung base. Remaining lungs clear. No pleural effusion or pneumothorax. Comminuted displaced fracture-dislocation of the  proximal left humerus.  IMPRESSION: Subsegmental atelectasis medial left lower lobe. Comminuted displaced fracture-dislocation of the proximal left humerus.   Original Report Authenticated By: Ulyses Southward, M.D.   Ct Head Wo Contrast  06/11/2013   *RADIOLOGY REPORT*  Clinical Data: Fall with head injury.  CT HEAD WITHOUT CONTRAST  Technique:  Contiguous axial images were obtained from the base of the skull through the vertex without contrast.  Comparison: None.  Findings: The ventricles and sulci are symmetrical without significant effacement, displacement, or dilatation. No mass effect or midline shift. No abnormal extra-axial fluid collections. The grey-white matter junction is distinct. Basal cisterns are not effaced. No acute intracranial hemorrhage. No depressed skull fractures.  Vascular calcifications.  There is near complete opacification of the right maxillary antrum with thickening of maxillary antral walls suggesting chronic disease.  Paranasal sinuses are otherwise not opacified.  Mastoid air cells are not opacified.  IMPRESSION: No acute intracranial abnormalities.  Chronic-appearing changes in the left maxillary antrum.   Original Report Authenticated By: Burman Nieves, M.D.   Dg Shoulder Left  06/20/2013   *RADIOLOGY REPORT*  Clinical Data:  ORIF proximal left humeral fracture  DG C-ARM 61-120 MIN, LEFT SHOULDER - 2+ VIEW  Comparison: Four digital C-arm fluoroscopic images obtained intraoperatively are compared to previous left humeral radiographic exam of 06/10/2013  Findings: Bones appear demineralized. Images demonstrate placement of a plate and multiple screws across a reduced fracture of the proximal left humerus. Previously identified glenohumeral dislocation appears reduced on the AP exam; no orthogonal view for correlation. No acute complication identified.  IMPRESSION: Post ORIF of proximal left humeral fracture with apparent reduction of previously identified glenohumeral dislocation  as above.   Original Report Authenticated By: Ulyses Southward, M.D.   Dg Humerus Left  06/11/2013   *RADIOLOGY REPORT*  Clinical Data: Left shoulder pain after fall.  LEFT HUMERUS - 2+ VIEW  Comparison: None.  Findings: The study is somewhat technically limited due to nonstandard views.  There appears to be acute displaced fracture of the left humeral neck with varus angulation.  Additional displaced bone fragments may be present.  The mid and distal humerus appear intact.  Degenerative changes at the acromioclavicular joint.  IMPRESSION: Technically limited study.  There is acute fracture with varus displacement of the left humeral neck.  There appear to be an additional displaced fracture fragments superiorly.   Original Report Authenticated By: Burman Nieves, M.D.   Dg C-arm 262 110 5527 Min  06/20/2013   *RADIOLOGY REPORT*  Clinical Data:  ORIF proximal left humeral fracture  DG C-ARM 61-120 MIN, LEFT SHOULDER - 2+ VIEW  Comparison: Four digital C-arm fluoroscopic images obtained intraoperatively are compared to previous left humeral radiographic exam of 06/10/2013  Findings: Bones appear demineralized. Images demonstrate placement of a plate and multiple screws across a reduced fracture of the proximal left humerus. Previously identified glenohumeral dislocation appears reduced on the AP exam; no orthogonal view for correlation. No acute complication identified.  IMPRESSION: Post ORIF of proximal left humeral fracture with apparent reduction of previously identified glenohumeral dislocation as above.   Original Report Authenticated By: Ulyses Southward, M.D.    Disposition: 01-Home or Self Care  Discharge Orders   Future Orders Complete By Expires   Discharge patient  As directed    Discharge wound care:  As directed    Comments:     May shower with dressing intact. Keep dressing clean and intact then remove dressing next Thursday and shower. Dry over wound area and then apply clean dressing .   Non  weight bearing  As directed    Comments:     Non weight bearing left arm. May come out of sling to shower , gentle range of motion of forearm and wrist. No overhead or abduction motion of shoulder.      Follow-up Information   Follow up with Kathryne Hitch, MD. Schedule an appointment as soon as possible for a visit in 2 weeks.   Specialty:  Orthopedic Surgery   Contact information:   [address] [place] [address] [phone]        Signed: Kathryne Hitch 06/21/2013, 6:42 AM

## 2013-06-21 NOTE — Discharge Instructions (Signed)
No reaching out, behind you, or overhead with your left arm. You can come out of the sling daily to bend your elbow and wrist and for comfort. You can get your current dressing wet in the shower. Leave your current dressing in place for the next 6 days; then you may remove it nd get your actual incision wet in the shower.

## 2013-06-21 NOTE — Progress Notes (Signed)
Subjective: 1 Day Post-Op Procedure(s) (LRB): OPEN REDUCTION INTERNAL FIXATION (ORIF) LEFT PROXIMAL HUMERUS FRACTURE (Left) Patient reports pain as mild.    Objective: Vital signs in last 24 hours: Temp:  [97.3 F (36.3 C)-98.1 F (36.7 C)] 97.6 F (36.4 C) (08/29 1610) Pulse Rate:  [54-89] 72 (08/29 0637) Resp:  [11-21] 20 (08/29 0637) BP: (112-173)/(42-82) 130/65 mmHg (08/29 0637) SpO2:  [89 %-100 %] 99 % (08/29 0637)  Intake/Output from previous day: 08/28 0701 - 08/29 0700 In: 2210 [P.O.:660; I.V.:1550] Out: 100 [Blood:100] Intake/Output this shift: Total I/O In: 360 [P.O.:360] Out: -    Recent Labs  06/19/13 1420  HGB 13.7    Recent Labs  06/19/13 1420  WBC 7.3  RBC 4.08  HCT 39.1  PLT 268    Recent Labs  06/19/13 1420  NA 139  K 4.5  CL 103  CO2 26  BUN 14  CREATININE 0.65  GLUCOSE 105*  CALCIUM 9.9   No results found for this basename: LABPT, INR,  in the last 72 hours  Intact pulses distally Incision: no drainage No cellulitis present  Assessment/Plan: 1 Day Post-Op Procedure(s) (LRB): OPEN REDUCTION INTERNAL FIXATION (ORIF) LEFT PROXIMAL HUMERUS FRACTURE (Left) Discharge to home  [facility] 06/21/2013, 6:39 AM

## 2014-05-29 IMAGING — CT CT HEAD W/O CM
2 series · 17 of 30 positions shown, 20 images · non-contrast
Comparison: None.

CLINICAL DATA: Fall with head injury.

CT HEAD WITHOUT CONTRAST
TECHNIQUE: Contiguous axial images were obtained from the base of
the skull through the vertex without contrast.

[Series 2: head w/o · axial · non-contrast · 0.48mm/px · z∈[+1220,+1340]mm · 9 of 31 slices shown, 12 images]
[im 4/31  brain]
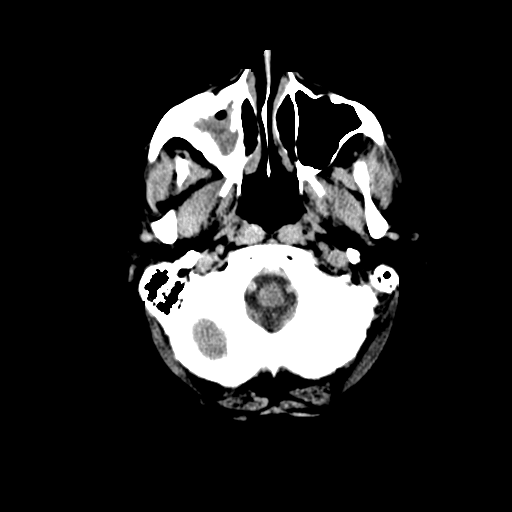
[im 4/31  bone]
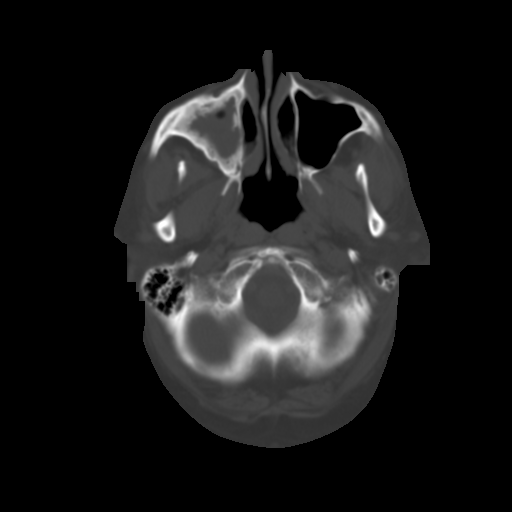
[im 7/31  brain]
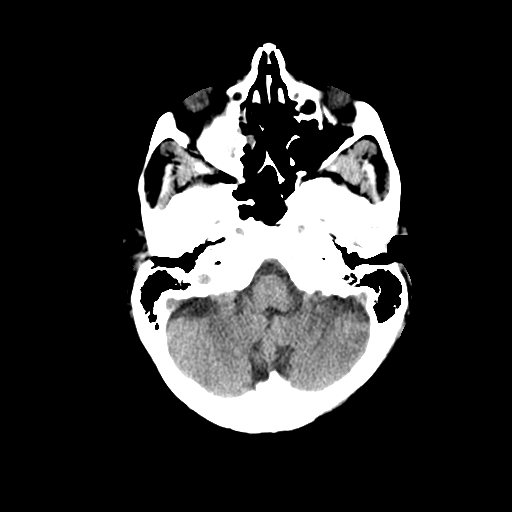
[im 10/31  brain]
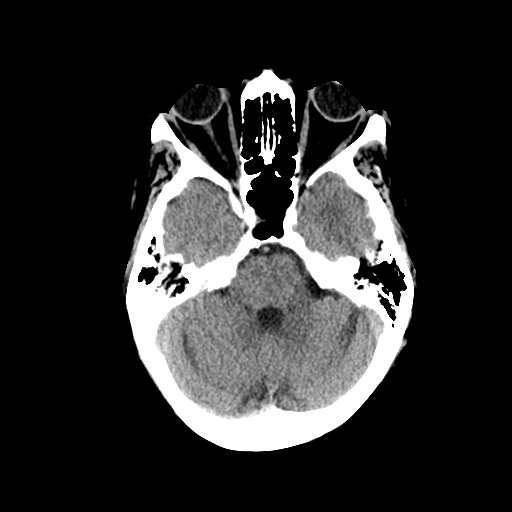
[im 13/31  brain]
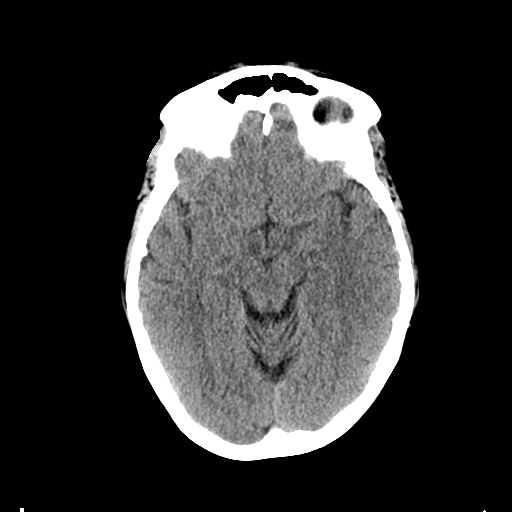
[im 16/31  brain]
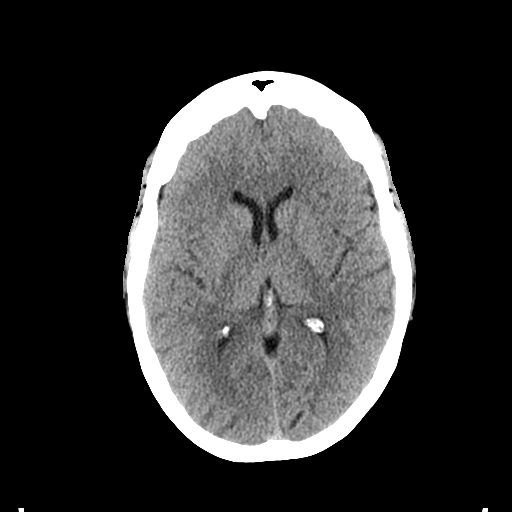
[im 16/31  bone]
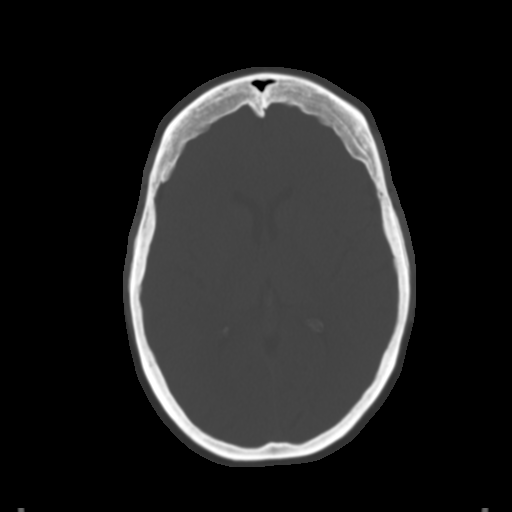
[im 19/31  brain]
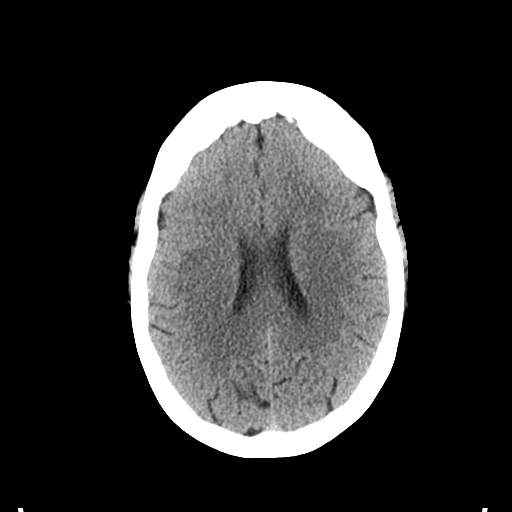
[im 22/31  brain]
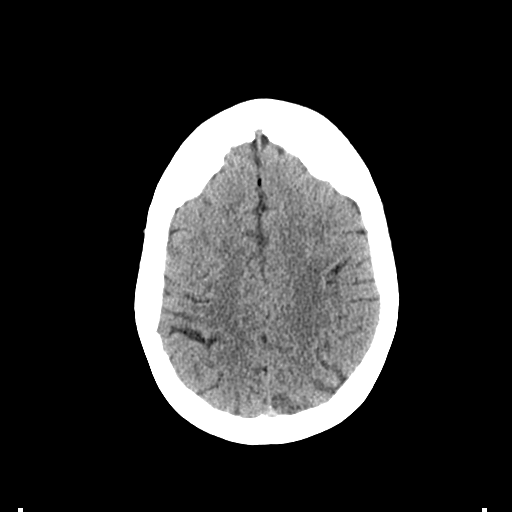
[im 25/31  brain]
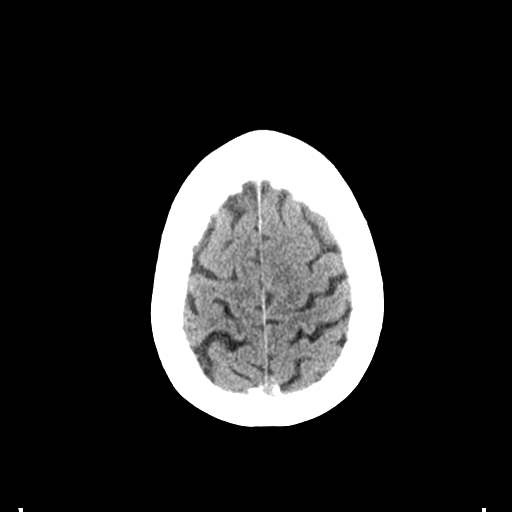
[im 28/31  brain]
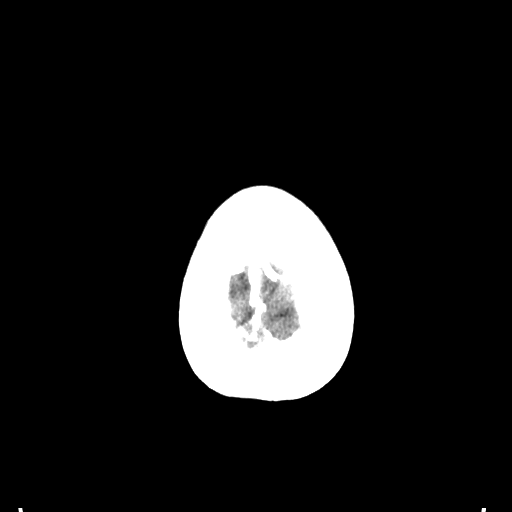
[im 28/31  bone]
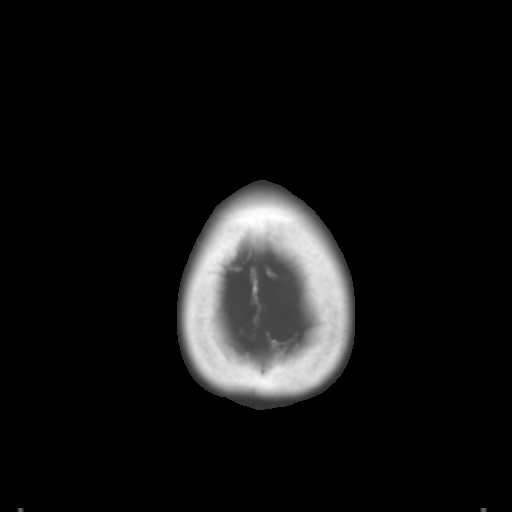

[Series 3: bone windows · axial · 0.48mm/px · z∈[+1220,+1337]mm · 8 of 51 slices shown]
[im 6/51  bone]
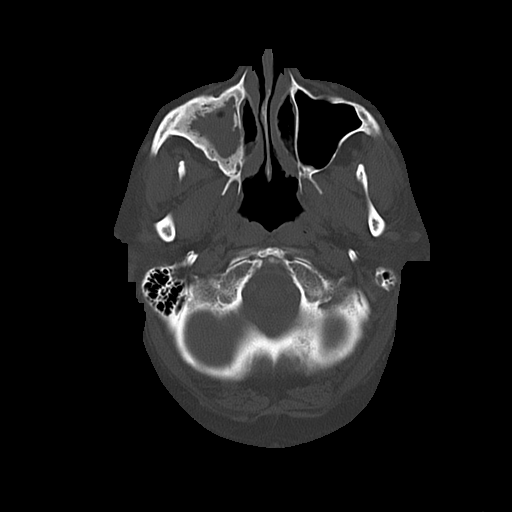
[im 12/51  bone]
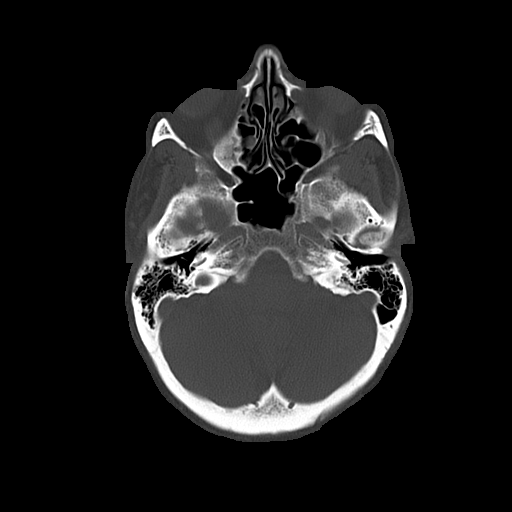
[im 17/51  bone]
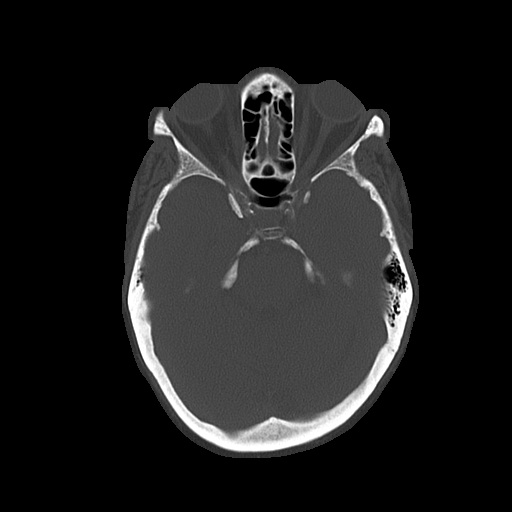
[im 23/51  bone]
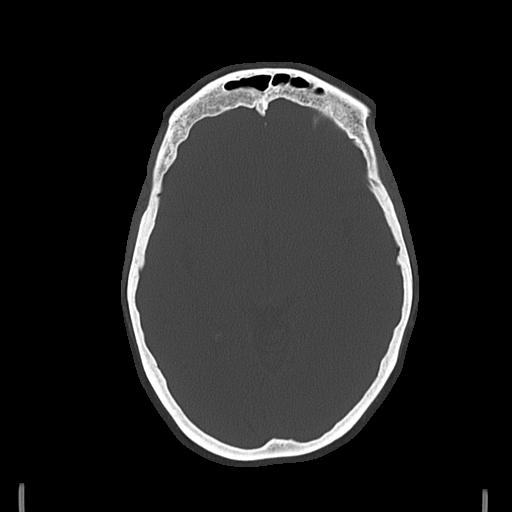
[im 28/51  bone]
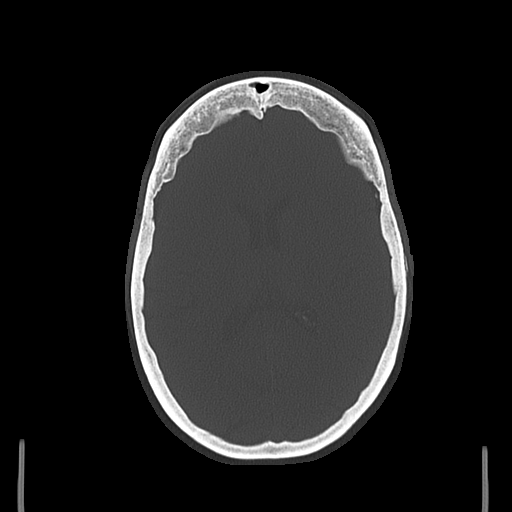
[im 34/51  bone]
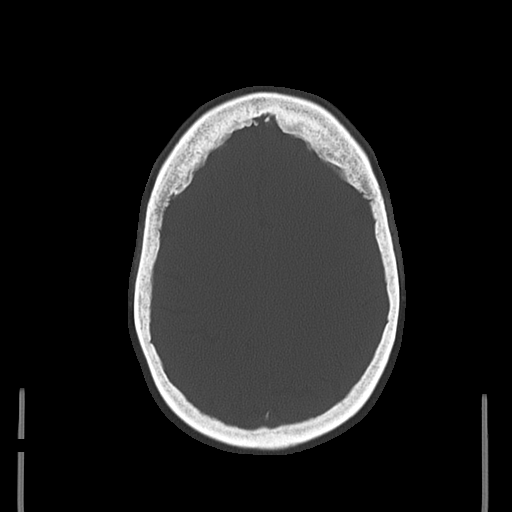
[im 39/51  bone]
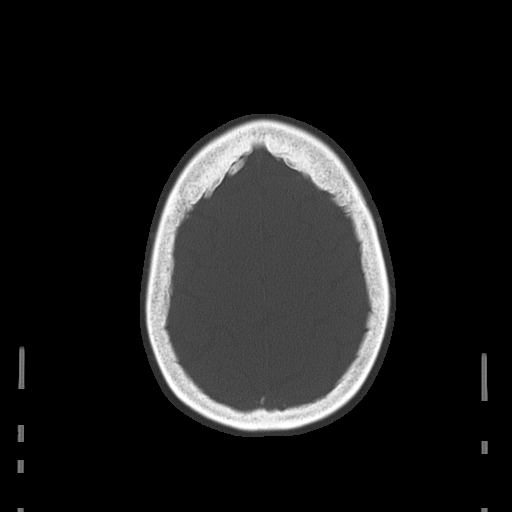
[im 45/51  bone]
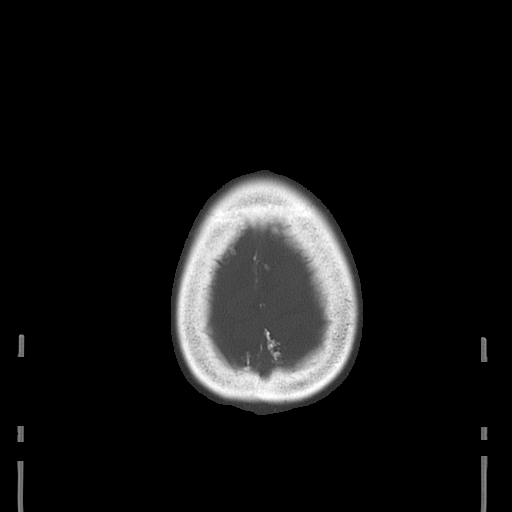

[17 of 30 positions shown; findings below may reference images not displayed]

FINDINGS: The ventricles and sulci are symmetrical without
significant effacement, displacement, or dilatation. No mass effect
or midline shift. No abnormal extra-axial fluid collections. The
grey-white matter junction is distinct. Basal cisterns are not
effaced. No acute intracranial hemorrhage. No depressed skull
fractures.  Vascular calcifications.  There is near complete
opacification of the right maxillary antrum with thickening of
maxillary antral walls suggesting chronic disease.  Paranasal
sinuses are otherwise not opacified.  Mastoid air cells are not
opacified.
IMPRESSION: No acute intracranial abnormalities.  Chronic-appearing changes in
the left maxillary antrum.

## 2015-01-14 DIAGNOSIS — Z1389 Encounter for screening for other disorder: Secondary | ICD-10-CM | POA: Diagnosis not present

## 2015-01-14 DIAGNOSIS — E1129 Type 2 diabetes mellitus with other diabetic kidney complication: Secondary | ICD-10-CM | POA: Diagnosis not present

## 2015-01-14 DIAGNOSIS — E785 Hyperlipidemia, unspecified: Secondary | ICD-10-CM | POA: Diagnosis not present

## 2015-01-14 DIAGNOSIS — Z6841 Body Mass Index (BMI) 40.0 and over, adult: Secondary | ICD-10-CM | POA: Diagnosis not present

## 2015-01-14 DIAGNOSIS — E1121 Type 2 diabetes mellitus with diabetic nephropathy: Secondary | ICD-10-CM | POA: Diagnosis not present

## 2015-01-14 DIAGNOSIS — I10 Essential (primary) hypertension: Secondary | ICD-10-CM | POA: Diagnosis not present

## 2015-06-25 DIAGNOSIS — E785 Hyperlipidemia, unspecified: Secondary | ICD-10-CM | POA: Diagnosis not present

## 2015-06-25 DIAGNOSIS — E1121 Type 2 diabetes mellitus with diabetic nephropathy: Secondary | ICD-10-CM | POA: Diagnosis not present

## 2015-06-25 DIAGNOSIS — N182 Chronic kidney disease, stage 2 (mild): Secondary | ICD-10-CM | POA: Diagnosis not present

## 2015-07-08 DIAGNOSIS — E669 Obesity, unspecified: Secondary | ICD-10-CM | POA: Diagnosis not present

## 2015-07-08 DIAGNOSIS — I10 Essential (primary) hypertension: Secondary | ICD-10-CM | POA: Diagnosis not present

## 2015-07-08 DIAGNOSIS — Z6841 Body Mass Index (BMI) 40.0 and over, adult: Secondary | ICD-10-CM | POA: Diagnosis not present

## 2015-07-08 DIAGNOSIS — E785 Hyperlipidemia, unspecified: Secondary | ICD-10-CM | POA: Diagnosis not present

## 2015-07-08 DIAGNOSIS — E1121 Type 2 diabetes mellitus with diabetic nephropathy: Secondary | ICD-10-CM | POA: Diagnosis not present

## 2015-07-08 DIAGNOSIS — Z0000 Encounter for general adult medical examination without abnormal findings: Secondary | ICD-10-CM | POA: Diagnosis not present

## 2015-07-08 DIAGNOSIS — E1129 Type 2 diabetes mellitus with other diabetic kidney complication: Secondary | ICD-10-CM | POA: Diagnosis not present

## 2015-07-08 DIAGNOSIS — Z23 Encounter for immunization: Secondary | ICD-10-CM | POA: Diagnosis not present

## 2015-07-14 DIAGNOSIS — Z1212 Encounter for screening for malignant neoplasm of rectum: Secondary | ICD-10-CM | POA: Diagnosis not present

## 2015-08-24 DIAGNOSIS — H43813 Vitreous degeneration, bilateral: Secondary | ICD-10-CM | POA: Diagnosis not present

## 2015-08-24 DIAGNOSIS — H524 Presbyopia: Secondary | ICD-10-CM | POA: Diagnosis not present

## 2015-08-24 DIAGNOSIS — E113393 Type 2 diabetes mellitus with moderate nonproliferative diabetic retinopathy without macular edema, bilateral: Secondary | ICD-10-CM | POA: Diagnosis not present

## 2015-08-24 DIAGNOSIS — H2513 Age-related nuclear cataract, bilateral: Secondary | ICD-10-CM | POA: Diagnosis not present

## 2015-10-12 DIAGNOSIS — E1129 Type 2 diabetes mellitus with other diabetic kidney complication: Secondary | ICD-10-CM | POA: Diagnosis not present

## 2015-10-12 DIAGNOSIS — E784 Other hyperlipidemia: Secondary | ICD-10-CM | POA: Diagnosis not present

## 2015-10-12 DIAGNOSIS — E1121 Type 2 diabetes mellitus with diabetic nephropathy: Secondary | ICD-10-CM | POA: Diagnosis not present

## 2015-10-12 DIAGNOSIS — I10 Essential (primary) hypertension: Secondary | ICD-10-CM | POA: Diagnosis not present

## 2015-10-12 DIAGNOSIS — Z6841 Body Mass Index (BMI) 40.0 and over, adult: Secondary | ICD-10-CM | POA: Diagnosis not present

## 2016-02-12 DIAGNOSIS — Z6841 Body Mass Index (BMI) 40.0 and over, adult: Secondary | ICD-10-CM | POA: Diagnosis not present

## 2016-02-12 DIAGNOSIS — Z1389 Encounter for screening for other disorder: Secondary | ICD-10-CM | POA: Diagnosis not present

## 2016-02-12 DIAGNOSIS — E1129 Type 2 diabetes mellitus with other diabetic kidney complication: Secondary | ICD-10-CM | POA: Diagnosis not present

## 2016-02-12 DIAGNOSIS — I10 Essential (primary) hypertension: Secondary | ICD-10-CM | POA: Diagnosis not present

## 2016-02-12 DIAGNOSIS — E1121 Type 2 diabetes mellitus with diabetic nephropathy: Secondary | ICD-10-CM | POA: Diagnosis not present

## 2016-05-24 ENCOUNTER — Encounter: Payer: Self-pay | Admitting: Internal Medicine

## 2016-06-01 ENCOUNTER — Encounter: Payer: Self-pay | Admitting: Internal Medicine

## 2016-06-17 ENCOUNTER — Encounter: Payer: Managed Care, Other (non HMO) | Admitting: Internal Medicine

## 2016-07-05 ENCOUNTER — Ambulatory Visit (AMBULATORY_SURGERY_CENTER): Payer: Self-pay

## 2016-07-05 VITALS — Ht 63.0 in | Wt 207.6 lb

## 2016-07-05 DIAGNOSIS — Z86010 Personal history of colonic polyps: Secondary | ICD-10-CM

## 2016-07-05 NOTE — Progress Notes (Signed)
Per pt, no allergies to soy or egg products.Pt not taking any weight loss meds or using  O2 at home. 

## 2016-07-19 ENCOUNTER — Ambulatory Visit (AMBULATORY_SURGERY_CENTER): Payer: Medicare Other | Admitting: Internal Medicine

## 2016-07-19 ENCOUNTER — Encounter: Payer: Self-pay | Admitting: Internal Medicine

## 2016-07-19 VITALS — BP 111/60 | HR 59 | Temp 98.6°F | Resp 11 | Ht 63.0 in | Wt 207.0 lb

## 2016-07-19 DIAGNOSIS — Z1211 Encounter for screening for malignant neoplasm of colon: Secondary | ICD-10-CM

## 2016-07-19 DIAGNOSIS — D125 Benign neoplasm of sigmoid colon: Secondary | ICD-10-CM

## 2016-07-19 DIAGNOSIS — Z86010 Personal history of colonic polyps: Secondary | ICD-10-CM | POA: Diagnosis not present

## 2016-07-19 DIAGNOSIS — K635 Polyp of colon: Secondary | ICD-10-CM | POA: Diagnosis not present

## 2016-07-19 DIAGNOSIS — D123 Benign neoplasm of transverse colon: Secondary | ICD-10-CM | POA: Diagnosis not present

## 2016-07-19 LAB — GLUCOSE, CAPILLARY
GLUCOSE-CAPILLARY: 114 mg/dL — AB (ref 65–99)
Glucose-Capillary: 143 mg/dL — ABNORMAL HIGH (ref 65–99)

## 2016-07-19 MED ORDER — SODIUM CHLORIDE 0.9 % IV SOLN: 500.0000 mL | INTRAVENOUS | Status: AC

## 2016-07-19 NOTE — Progress Notes (Signed)
Spontaneous respirations throughout. VSS. Resting comfortably. To PACU on room air. Report to  April RN. 

## 2016-07-19 NOTE — Progress Notes (Signed)
Pt did not pass any air in recovery but pt states she does not feel like she has any gas, pt denies cramping or abd pain, discharge instructions reviewed, pt verbalizes understanding of s/s of complications, discharged home-adm

## 2016-07-19 NOTE — Patient Instructions (Addendum)
   I found and removed 3 polyps - all look benign. I will let you know pathology results and when to have another routine colonoscopy by mail.  I appreciate the opportunity to care for you. Gatha Mayer, MD, FACG  YOU HAD AN ENDOSCOPIC PROCEDURE TODAY AT [facility]:   Refer to the procedure report that was given to you for any specific questions about what was found during the examination.  If the procedure report does not answer your questions, please call your gastroenterologist to clarify.  If you requested that your care partner not be given the details of your procedure findings, then the procedure report has been included in a sealed envelope for you to review at your convenience later.  YOU SHOULD EXPECT: Some feelings of bloating in the abdomen. Passage of more gas than usual.  Walking can help get rid of the air that was put into your GI tract during the procedure and reduce the bloating. If you had a lower endoscopy (such as a colonoscopy or flexible sigmoidoscopy) you may notice spotting of blood in your stool or on the toilet paper. If you underwent a bowel prep for your procedure, you may not have a normal bowel movement for a few days.  Please Note:  You might notice some irritation and congestion in your nose or some drainage.  This is from the oxygen used during your procedure.  There is no need for concern and it should clear up in a day or so.  SYMPTOMS TO REPORT IMMEDIATELY:   Following lower endoscopy (colonoscopy or flexible sigmoidoscopy):  Excessive amounts of blood in the stool  Significant tenderness or worsening of abdominal pains  Swelling of the abdomen that is new, acute  Fever of 100F or higher   For urgent or emergent issues, a gastroenterologist can be reached at any hour by calling [phone].   DIET:  We do recommend a small meal at first, but then you may proceed to your regular diet.  Drink plenty of fluids but you should  avoid alcoholic beverages for 24 hours.  ACTIVITY:  You should plan to take it easy for the rest of today and you should NOT DRIVE or use heavy machinery until tomorrow (because of the sedation medicines used during the test).    FOLLOW UP: Our staff will call the number listed on your records the next business day following your procedure to check on you and address any questions or concerns that you may have regarding the information given to you following your procedure. If we do not reach you, we will leave a message.  However, if you are feeling well and you are not experiencing any problems, there is no need to return our call.  We will assume that you have returned to your regular daily activities without incident.  If any biopsies were taken you will be contacted by phone or by letter within the next 1-3 weeks.  Please call us at [phone] if you have not heard about the biopsies in 3 weeks.    SIGNATURES/CONFIDENTIALITY: You and/or your care partner have signed paperwork which will be entered into your electronic medical record.  These signatures attest to the fact that that the information above on your After Visit Summary has been reviewed and is understood.  Full responsibility of the confidentiality of this discharge information lies with you and/or your care-partner.  Polyps-handout given  Repeat colonoscopy will be determined by pathology.

## 2016-07-19 NOTE — Progress Notes (Signed)
Called to room for pathology. 

## 2016-07-19 NOTE — Op Note (Signed)
[facility] Patient Name: [name] Procedure Date: 07/19/2016 1:29 PM MRN: [id number] Endoscopist: Gatha Mayer , MD Age: 65 Referring MD:  Date of Birth: 04/01/1951 Gender: Female Account #: [id number] Procedure:                Colonoscopy Indications:              Screening for colorectal malignant neoplasm, Last                            colonoscopy: 2007 Medicines:                Propofol per Anesthesia, Monitored Anesthesia Care Procedure:                Pre-Anesthesia Assessment:                           - Prior to the procedure, a History and Physical                            was performed, and patient medications and                            allergies were reviewed. The patient's tolerance of                            previous anesthesia was also reviewed. The risks                            and benefits of the procedure and the sedation                            options and risks were discussed with the patient.                            All questions were answered, and informed consent                            was obtained. Prior Anticoagulants: The patient                            last took ibuprofen 5 days prior to the procedure.                            ASA Grade Assessment: II - A patient with mild                            systemic disease. After reviewing the risks and                            benefits, the patient was deemed in satisfactory                            condition to undergo the procedure.  After obtaining informed consent, the colonoscope                            was passed under direct vision. Throughout the                            procedure, the patient's blood pressure, pulse, and                            oxygen saturations were monitored continuously. The                            Model CF-HQ190L [id number]) scope was introduced                            through the anus and  advanced to the the cecum,                            identified by appendiceal orifice and ileocecal                            valve. The colonoscopy was performed without                            difficulty. The patient tolerated the procedure                            well. The quality of the bowel preparation was                            good. The bowel preparation used was Miralax. The                            ileocecal valve, appendiceal orifice, and rectum                            were photographed. Scope In: 1:40:19 PM Scope Out: 1:58:15 PM Scope Withdrawal Time: 0 hours 15 minutes 53 seconds  Total Procedure Duration: 0 hours 17 minutes 56 seconds  Findings:                 The perianal and digital rectal examinations were                            normal.                           Two sessile polyps were found in the transverse                            colon. The polyps were 5 to 12 mm in size. These                            polyps were removed with a cold snare. Resection  and retrieval were complete. Verification of                            patient identification for the specimen was done.                            Estimated blood loss was minimal.                           A 2 mm polyp was found in the sigmoid colon. The                            polyp was sessile. The polyp was removed with a                            cold biopsy forceps. Resection and retrieval were                            complete. Verification of patient identification                            for the specimen was done. Estimated blood loss was                            minimal.                           The exam was otherwise without abnormality on                            direct and retroflexion views. Complications:            No immediate complications. Estimated Blood Loss:     Estimated blood loss was minimal. Impression:               - Two 5  to 12 mm polyps in the transverse colon,                            removed with a cold snare. Resected and retrieved.                           - One 2 mm polyp in the sigmoid colon, removed with                            a cold biopsy forceps. Resected and retrieved.                           - The examination was otherwise normal on direct                            and retroflexion views. Recommendation:           - Patient has a contact number available for  emergencies. The signs and symptoms of potential                            delayed complications were discussed with the                            patient. Return to normal activities tomorrow.                            Written discharge instructions were provided to the                            patient.                           - Resume previous diet.                           - Continue present medications.                           - Repeat colonoscopy is recommended. The                            colonoscopy date will be determined after pathology                            results from today's exam become available for                            review. Gatha Mayer, MD 07/19/2016 2:08:16 PM This report has been signed electronically.

## 2016-07-20 ENCOUNTER — Telehealth: Payer: Self-pay

## 2016-07-20 NOTE — Telephone Encounter (Signed)
  Follow up Call-  Call back number 07/19/2016  Post procedure Call Back phone  # [phone]  Permission to leave phone message Yes  Some recent data might be hidden     Patient questions:  Do you have a fever, pain , or abdominal swelling? No. Pain Score  0 *  Have you tolerated food without any problems? Yes.    Have you been able to return to your normal activities? Yes.    Do you have any questions about your discharge instructions: Diet   No. Medications  No. Follow up visit  No.  Do you have questions or concerns about your Care? No.  Actions: * If pain score is 4 or above: No action needed, pain <4.

## 2016-07-21 DIAGNOSIS — Z860100 Personal history of colon polyps, unspecified: Secondary | ICD-10-CM

## 2016-07-21 HISTORY — DX: Personal history of colonic polyps: Z86.010

## 2016-07-21 HISTORY — DX: Personal history of colon polyps, unspecified: Z86.0100

## 2016-07-27 ENCOUNTER — Encounter: Payer: Self-pay | Admitting: Internal Medicine

## 2016-07-27 NOTE — Progress Notes (Signed)
Adenoma and hyperplastic right colon polyps max 12 mm Recall 2020

## 2016-07-27 NOTE — Progress Notes (Signed)
My chart message Colon recall 2020 No letter

## 2016-07-30 DIAGNOSIS — Z23 Encounter for immunization: Secondary | ICD-10-CM | POA: Diagnosis not present

## 2016-08-01 ENCOUNTER — Telehealth: Payer: Self-pay | Admitting: Internal Medicine

## 2016-08-01 NOTE — Telephone Encounter (Signed)
I reviewed MYChart message that was sent to the patient .  All questions answered.  She will call back for any additional questions or concerns.

## 2016-08-10 DIAGNOSIS — E784 Other hyperlipidemia: Secondary | ICD-10-CM | POA: Diagnosis not present

## 2016-08-10 DIAGNOSIS — I10 Essential (primary) hypertension: Secondary | ICD-10-CM | POA: Diagnosis not present

## 2016-08-10 DIAGNOSIS — E1129 Type 2 diabetes mellitus with other diabetic kidney complication: Secondary | ICD-10-CM | POA: Diagnosis not present

## 2016-08-17 DIAGNOSIS — E784 Other hyperlipidemia: Secondary | ICD-10-CM | POA: Diagnosis not present

## 2016-08-17 DIAGNOSIS — Z6839 Body mass index (BMI) 39.0-39.9, adult: Secondary | ICD-10-CM | POA: Diagnosis not present

## 2016-08-17 DIAGNOSIS — Z23 Encounter for immunization: Secondary | ICD-10-CM | POA: Diagnosis not present

## 2016-08-17 DIAGNOSIS — J3089 Other allergic rhinitis: Secondary | ICD-10-CM | POA: Diagnosis not present

## 2016-08-17 DIAGNOSIS — M720 Palmar fascial fibromatosis [Dupuytren]: Secondary | ICD-10-CM | POA: Diagnosis not present

## 2016-08-17 DIAGNOSIS — E1129 Type 2 diabetes mellitus with other diabetic kidney complication: Secondary | ICD-10-CM | POA: Diagnosis not present

## 2016-08-17 DIAGNOSIS — Z0000 Encounter for general adult medical examination without abnormal findings: Secondary | ICD-10-CM | POA: Diagnosis not present

## 2016-08-17 DIAGNOSIS — I10 Essential (primary) hypertension: Secondary | ICD-10-CM | POA: Diagnosis not present

## 2016-08-19 DIAGNOSIS — Z124 Encounter for screening for malignant neoplasm of cervix: Secondary | ICD-10-CM | POA: Diagnosis not present

## 2016-08-19 DIAGNOSIS — R875 Abnormal microbiological findings in specimens from female genital organs: Secondary | ICD-10-CM | POA: Diagnosis not present

## 2016-08-24 DIAGNOSIS — H40033 Anatomical narrow angle, bilateral: Secondary | ICD-10-CM | POA: Diagnosis not present

## 2016-08-24 DIAGNOSIS — E119 Type 2 diabetes mellitus without complications: Secondary | ICD-10-CM | POA: Diagnosis not present

## 2017-01-12 DIAGNOSIS — Z6839 Body mass index (BMI) 39.0-39.9, adult: Secondary | ICD-10-CM | POA: Diagnosis not present

## 2017-01-12 DIAGNOSIS — I10 Essential (primary) hypertension: Secondary | ICD-10-CM | POA: Diagnosis not present

## 2017-01-12 DIAGNOSIS — M1990 Unspecified osteoarthritis, unspecified site: Secondary | ICD-10-CM | POA: Diagnosis not present

## 2017-01-12 DIAGNOSIS — E1129 Type 2 diabetes mellitus with other diabetic kidney complication: Secondary | ICD-10-CM | POA: Diagnosis not present

## 2017-05-01 DIAGNOSIS — I10 Essential (primary) hypertension: Secondary | ICD-10-CM | POA: Diagnosis not present

## 2017-05-01 DIAGNOSIS — E784 Other hyperlipidemia: Secondary | ICD-10-CM | POA: Diagnosis not present

## 2017-05-01 DIAGNOSIS — E1129 Type 2 diabetes mellitus with other diabetic kidney complication: Secondary | ICD-10-CM | POA: Diagnosis not present

## 2017-05-01 DIAGNOSIS — Z1389 Encounter for screening for other disorder: Secondary | ICD-10-CM | POA: Diagnosis not present

## 2017-05-01 DIAGNOSIS — Z6839 Body mass index (BMI) 39.0-39.9, adult: Secondary | ICD-10-CM | POA: Diagnosis not present

## 2017-08-16 DIAGNOSIS — E7849 Other hyperlipidemia: Secondary | ICD-10-CM | POA: Diagnosis not present

## 2017-08-16 DIAGNOSIS — E1129 Type 2 diabetes mellitus with other diabetic kidney complication: Secondary | ICD-10-CM | POA: Diagnosis not present

## 2017-08-16 DIAGNOSIS — Z0000 Encounter for general adult medical examination without abnormal findings: Secondary | ICD-10-CM | POA: Diagnosis not present

## 2017-08-16 DIAGNOSIS — I10 Essential (primary) hypertension: Secondary | ICD-10-CM | POA: Diagnosis not present

## 2017-08-21 DIAGNOSIS — E7849 Other hyperlipidemia: Secondary | ICD-10-CM | POA: Diagnosis not present

## 2017-08-21 DIAGNOSIS — Z1389 Encounter for screening for other disorder: Secondary | ICD-10-CM | POA: Diagnosis not present

## 2017-08-21 DIAGNOSIS — I10 Essential (primary) hypertension: Secondary | ICD-10-CM | POA: Diagnosis not present

## 2017-08-21 DIAGNOSIS — Z23 Encounter for immunization: Secondary | ICD-10-CM | POA: Diagnosis not present

## 2017-08-21 DIAGNOSIS — E1121 Type 2 diabetes mellitus with diabetic nephropathy: Secondary | ICD-10-CM | POA: Diagnosis not present

## 2017-08-21 DIAGNOSIS — Z0000 Encounter for general adult medical examination without abnormal findings: Secondary | ICD-10-CM | POA: Diagnosis not present

## 2017-08-21 DIAGNOSIS — Z6839 Body mass index (BMI) 39.0-39.9, adult: Secondary | ICD-10-CM | POA: Diagnosis not present

## 2017-08-21 DIAGNOSIS — E1129 Type 2 diabetes mellitus with other diabetic kidney complication: Secondary | ICD-10-CM | POA: Diagnosis not present

## 2017-08-21 DIAGNOSIS — M1990 Unspecified osteoarthritis, unspecified site: Secondary | ICD-10-CM | POA: Diagnosis not present

## 2017-08-23 DIAGNOSIS — E119 Type 2 diabetes mellitus without complications: Secondary | ICD-10-CM | POA: Diagnosis not present

## 2017-08-23 DIAGNOSIS — H40033 Anatomical narrow angle, bilateral: Secondary | ICD-10-CM | POA: Diagnosis not present

## 2017-08-28 DIAGNOSIS — Z1212 Encounter for screening for malignant neoplasm of rectum: Secondary | ICD-10-CM | POA: Diagnosis not present

## 2017-08-30 DIAGNOSIS — M720 Palmar fascial fibromatosis [Dupuytren]: Secondary | ICD-10-CM | POA: Diagnosis not present

## 2017-08-30 DIAGNOSIS — M79642 Pain in left hand: Secondary | ICD-10-CM | POA: Diagnosis not present

## 2017-08-30 DIAGNOSIS — M79641 Pain in right hand: Secondary | ICD-10-CM | POA: Diagnosis not present

## 2017-12-21 DIAGNOSIS — E1129 Type 2 diabetes mellitus with other diabetic kidney complication: Secondary | ICD-10-CM | POA: Diagnosis not present

## 2017-12-21 DIAGNOSIS — E7849 Other hyperlipidemia: Secondary | ICD-10-CM | POA: Diagnosis not present

## 2017-12-21 DIAGNOSIS — B351 Tinea unguium: Secondary | ICD-10-CM | POA: Diagnosis not present

## 2017-12-21 DIAGNOSIS — Z6839 Body mass index (BMI) 39.0-39.9, adult: Secondary | ICD-10-CM | POA: Diagnosis not present

## 2017-12-21 DIAGNOSIS — I10 Essential (primary) hypertension: Secondary | ICD-10-CM | POA: Diagnosis not present

## 2017-12-21 DIAGNOSIS — Z1389 Encounter for screening for other disorder: Secondary | ICD-10-CM | POA: Diagnosis not present

## 2017-12-21 DIAGNOSIS — M720 Palmar fascial fibromatosis [Dupuytren]: Secondary | ICD-10-CM | POA: Diagnosis not present

## 2017-12-27 ENCOUNTER — Ambulatory Visit (INDEPENDENT_AMBULATORY_CARE_PROVIDER_SITE_OTHER): Payer: Medicare Other | Admitting: Podiatry

## 2017-12-27 ENCOUNTER — Encounter: Payer: Self-pay | Admitting: Podiatry

## 2017-12-27 DIAGNOSIS — M79675 Pain in left toe(s): Secondary | ICD-10-CM | POA: Diagnosis not present

## 2017-12-27 DIAGNOSIS — L608 Other nail disorders: Secondary | ICD-10-CM

## 2017-12-27 DIAGNOSIS — B351 Tinea unguium: Secondary | ICD-10-CM

## 2017-12-27 DIAGNOSIS — M79674 Pain in right toe(s): Secondary | ICD-10-CM

## 2017-12-27 NOTE — Progress Notes (Signed)
   Subjective:    Patient ID: [name], female    DOB: Jul 01, 1951, 67 y.o.   MRN: [id number]  HPI this patient presents the office stating that she's having pain and discomfort in both big toes on both feet.  She says pain occurs walking and wearing her shoes.  She says she believes the pain is due to the curvature of the nails.  There is thickening and discoloration noted to the left big toenail.  She has been soaking her foot in Epsom salts hoping to remedy her nail pain.  She presents the office per referral from her medical doctor.  She presents the office today for preventative foot care services.    Review of Systems  All other systems reviewed and are negative.      Objective:   Physical Exam General Appearance  Alert, conversant and in no acute stress.  Vascular  Dorsalis pedis and posterior tibial  pulses are palpable  bilaterally.  Capillary return is within normal limits  bilaterally. Temperature is within normal limits  bilaterally.  Neurologic  Senn-Weinstein monofilament wire test within normal limits  bilaterally. Muscle power within normal limits bilaterally.  Nails Thick disfigured discolored nails with subungual debris  from hallux  bilaterally. No evidence of bacterial infection or drainage bilaterally. Pincer hallux nails  B/L  Orthopedic  No limitations of motion of motion feet .  No crepitus or effusions noted.  No bony pathology or digital deformities noted.  Skin  normotropic skin with no porokeratosis noted bilaterally.  No signs of infections or ulcers noted.          Assessment & Plan:  Onychomycosis  Diabetes with no foot complications.  IE  Debride nails. Discussed conservative vs. Surgical treatment with patient.  RTC 3 months   Gardiner Barefoot DPM

## 2018-03-30 ENCOUNTER — Encounter: Payer: Self-pay | Admitting: Podiatry

## 2018-03-30 ENCOUNTER — Ambulatory Visit (INDEPENDENT_AMBULATORY_CARE_PROVIDER_SITE_OTHER): Payer: Medicare Other | Admitting: Podiatry

## 2018-03-30 DIAGNOSIS — M79674 Pain in right toe(s): Secondary | ICD-10-CM | POA: Diagnosis not present

## 2018-03-30 DIAGNOSIS — M79675 Pain in left toe(s): Secondary | ICD-10-CM | POA: Diagnosis not present

## 2018-03-30 DIAGNOSIS — B351 Tinea unguium: Secondary | ICD-10-CM | POA: Diagnosis not present

## 2018-03-30 DIAGNOSIS — L608 Other nail disorders: Secondary | ICD-10-CM

## 2018-03-30 NOTE — Progress Notes (Signed)
Complaint:  Visit Type: Patient returns to my office for continued preventative foot care services. Complaint: Patient states" my nails have grown long and thick and become painful to walk and wear shoes" The patient presents for preventative foot care services. No changes to ROS  Podiatric Exam: Vascular: dorsalis pedis and posterior tibial pulses are palpable bilateral. Capillary return is immediate. Temperature gradient is WNL. Skin turgor WNL  Sensorium: Normal Semmes Weinstein monofilament test. Normal tactile sensation bilaterally. Nail Exam: Pt has thick disfigured discolored nails with subungual debris noted bilateral entire nail hallux through fifth toenails.  Pincer nails  B/L. Ulcer Exam: There is no evidence of ulcer or pre-ulcerative changes or infection. Orthopedic Exam: Muscle tone and strength are WNL. No limitations in general ROM. No crepitus or effusions noted. Foot type and digits show no abnormalities. Bony prominences are unremarkable. Skin: No Porokeratosis. No infection or ulcers  Diagnosis:  Onychomycosis, , Pain in right toe, pain in left toes  Treatment & Plan Procedures and Treatment: Consent by patient was obtained for treatment procedures.   Debridement of mycotic and hypertrophic toenails, 1 through 5 bilateral and clearing of subungual debris. No ulceration, no infection noted.  Return Visit-Office Procedure: Patient instructed to return to the office for a follow up visit  10 weeks for continued evaluation and treatment.    Gardiner Barefoot DPM

## 2018-05-01 DIAGNOSIS — Z6839 Body mass index (BMI) 39.0-39.9, adult: Secondary | ICD-10-CM | POA: Diagnosis not present

## 2018-05-01 DIAGNOSIS — E1129 Type 2 diabetes mellitus with other diabetic kidney complication: Secondary | ICD-10-CM | POA: Diagnosis not present

## 2018-05-01 DIAGNOSIS — E1121 Type 2 diabetes mellitus with diabetic nephropathy: Secondary | ICD-10-CM | POA: Diagnosis not present

## 2018-05-01 DIAGNOSIS — I10 Essential (primary) hypertension: Secondary | ICD-10-CM | POA: Diagnosis not present

## 2018-06-06 ENCOUNTER — Ambulatory Visit: Payer: Medicare Other | Admitting: Podiatry

## 2018-07-10 ENCOUNTER — Ambulatory Visit: Payer: Medicare Other | Admitting: Podiatry

## 2018-08-21 DIAGNOSIS — E7849 Other hyperlipidemia: Secondary | ICD-10-CM | POA: Diagnosis not present

## 2018-08-21 DIAGNOSIS — I10 Essential (primary) hypertension: Secondary | ICD-10-CM | POA: Diagnosis not present

## 2018-08-21 DIAGNOSIS — E1129 Type 2 diabetes mellitus with other diabetic kidney complication: Secondary | ICD-10-CM | POA: Diagnosis not present

## 2018-08-21 DIAGNOSIS — R82998 Other abnormal findings in urine: Secondary | ICD-10-CM | POA: Diagnosis not present

## 2018-08-22 DIAGNOSIS — E119 Type 2 diabetes mellitus without complications: Secondary | ICD-10-CM | POA: Diagnosis not present

## 2018-08-22 DIAGNOSIS — H40033 Anatomical narrow angle, bilateral: Secondary | ICD-10-CM | POA: Diagnosis not present

## 2018-08-28 DIAGNOSIS — Z0000 Encounter for general adult medical examination without abnormal findings: Secondary | ICD-10-CM | POA: Diagnosis not present

## 2018-08-28 DIAGNOSIS — I10 Essential (primary) hypertension: Secondary | ICD-10-CM | POA: Diagnosis not present

## 2018-08-28 DIAGNOSIS — M720 Palmar fascial fibromatosis [Dupuytren]: Secondary | ICD-10-CM | POA: Diagnosis not present

## 2018-08-28 DIAGNOSIS — Z6839 Body mass index (BMI) 39.0-39.9, adult: Secondary | ICD-10-CM | POA: Diagnosis not present

## 2018-08-28 DIAGNOSIS — I7389 Other specified peripheral vascular diseases: Secondary | ICD-10-CM | POA: Diagnosis not present

## 2018-08-28 DIAGNOSIS — Z23 Encounter for immunization: Secondary | ICD-10-CM | POA: Diagnosis not present

## 2018-08-28 DIAGNOSIS — Z1389 Encounter for screening for other disorder: Secondary | ICD-10-CM | POA: Diagnosis not present

## 2018-08-28 DIAGNOSIS — E7849 Other hyperlipidemia: Secondary | ICD-10-CM | POA: Diagnosis not present

## 2018-08-28 DIAGNOSIS — E1151 Type 2 diabetes mellitus with diabetic peripheral angiopathy without gangrene: Secondary | ICD-10-CM | POA: Diagnosis not present

## 2018-09-07 DIAGNOSIS — Z1212 Encounter for screening for malignant neoplasm of rectum: Secondary | ICD-10-CM | POA: Diagnosis not present

## 2018-10-29 DIAGNOSIS — Z1231 Encounter for screening mammogram for malignant neoplasm of breast: Secondary | ICD-10-CM | POA: Diagnosis not present

## 2018-12-28 DIAGNOSIS — E7849 Other hyperlipidemia: Secondary | ICD-10-CM | POA: Diagnosis not present

## 2018-12-28 DIAGNOSIS — I10 Essential (primary) hypertension: Secondary | ICD-10-CM | POA: Diagnosis not present

## 2018-12-28 DIAGNOSIS — I7389 Other specified peripheral vascular diseases: Secondary | ICD-10-CM | POA: Diagnosis not present

## 2018-12-28 DIAGNOSIS — Z6839 Body mass index (BMI) 39.0-39.9, adult: Secondary | ICD-10-CM | POA: Diagnosis not present

## 2018-12-28 DIAGNOSIS — M720 Palmar fascial fibromatosis [Dupuytren]: Secondary | ICD-10-CM | POA: Diagnosis not present

## 2018-12-28 DIAGNOSIS — E1151 Type 2 diabetes mellitus with diabetic peripheral angiopathy without gangrene: Secondary | ICD-10-CM | POA: Diagnosis not present

## 2019-04-30 DIAGNOSIS — I10 Essential (primary) hypertension: Secondary | ICD-10-CM | POA: Diagnosis not present

## 2019-04-30 DIAGNOSIS — I739 Peripheral vascular disease, unspecified: Secondary | ICD-10-CM | POA: Diagnosis not present

## 2019-04-30 DIAGNOSIS — E7849 Other hyperlipidemia: Secondary | ICD-10-CM | POA: Diagnosis not present

## 2019-04-30 DIAGNOSIS — E1151 Type 2 diabetes mellitus with diabetic peripheral angiopathy without gangrene: Secondary | ICD-10-CM | POA: Diagnosis not present

## 2019-06-24 ENCOUNTER — Encounter: Payer: Self-pay | Admitting: Internal Medicine

## 2019-08-27 DIAGNOSIS — Z23 Encounter for immunization: Secondary | ICD-10-CM | POA: Diagnosis not present

## 2019-08-27 DIAGNOSIS — E7849 Other hyperlipidemia: Secondary | ICD-10-CM | POA: Diagnosis not present

## 2019-08-27 DIAGNOSIS — E1129 Type 2 diabetes mellitus with other diabetic kidney complication: Secondary | ICD-10-CM | POA: Diagnosis not present

## 2019-08-29 DIAGNOSIS — R82998 Other abnormal findings in urine: Secondary | ICD-10-CM | POA: Diagnosis not present

## 2019-08-29 DIAGNOSIS — I10 Essential (primary) hypertension: Secondary | ICD-10-CM | POA: Diagnosis not present

## 2019-09-02 DIAGNOSIS — Z1212 Encounter for screening for malignant neoplasm of rectum: Secondary | ICD-10-CM | POA: Diagnosis not present

## 2019-09-03 DIAGNOSIS — M1990 Unspecified osteoarthritis, unspecified site: Secondary | ICD-10-CM | POA: Diagnosis not present

## 2019-09-03 DIAGNOSIS — Z1331 Encounter for screening for depression: Secondary | ICD-10-CM | POA: Diagnosis not present

## 2019-09-03 DIAGNOSIS — I10 Essential (primary) hypertension: Secondary | ICD-10-CM | POA: Diagnosis not present

## 2019-09-03 DIAGNOSIS — M720 Palmar fascial fibromatosis [Dupuytren]: Secondary | ICD-10-CM | POA: Diagnosis not present

## 2019-09-03 DIAGNOSIS — Z1339 Encounter for screening examination for other mental health and behavioral disorders: Secondary | ICD-10-CM | POA: Diagnosis not present

## 2019-09-03 DIAGNOSIS — I739 Peripheral vascular disease, unspecified: Secondary | ICD-10-CM | POA: Diagnosis not present

## 2019-09-03 DIAGNOSIS — Z0000 Encounter for general adult medical examination without abnormal findings: Secondary | ICD-10-CM | POA: Diagnosis not present

## 2019-09-03 DIAGNOSIS — E785 Hyperlipidemia, unspecified: Secondary | ICD-10-CM | POA: Diagnosis not present

## 2019-09-03 DIAGNOSIS — E1151 Type 2 diabetes mellitus with diabetic peripheral angiopathy without gangrene: Secondary | ICD-10-CM | POA: Diagnosis not present

## 2020-01-06 DIAGNOSIS — E1151 Type 2 diabetes mellitus with diabetic peripheral angiopathy without gangrene: Secondary | ICD-10-CM | POA: Diagnosis not present

## 2020-01-06 DIAGNOSIS — Z1331 Encounter for screening for depression: Secondary | ICD-10-CM | POA: Diagnosis not present

## 2020-01-06 DIAGNOSIS — I10 Essential (primary) hypertension: Secondary | ICD-10-CM | POA: Diagnosis not present

## 2020-01-06 DIAGNOSIS — I739 Peripheral vascular disease, unspecified: Secondary | ICD-10-CM | POA: Diagnosis not present

## 2020-01-08 DIAGNOSIS — Z23 Encounter for immunization: Secondary | ICD-10-CM | POA: Diagnosis not present

## 2020-02-05 DIAGNOSIS — Z23 Encounter for immunization: Secondary | ICD-10-CM | POA: Diagnosis not present

## 2020-03-06 DIAGNOSIS — N182 Chronic kidney disease, stage 2 (mild): Secondary | ICD-10-CM | POA: Diagnosis not present

## 2020-03-06 DIAGNOSIS — I129 Hypertensive chronic kidney disease with stage 1 through stage 4 chronic kidney disease, or unspecified chronic kidney disease: Secondary | ICD-10-CM | POA: Diagnosis not present

## 2020-03-06 DIAGNOSIS — J189 Pneumonia, unspecified organism: Secondary | ICD-10-CM | POA: Diagnosis not present

## 2020-03-06 DIAGNOSIS — J302 Other seasonal allergic rhinitis: Secondary | ICD-10-CM | POA: Diagnosis not present

## 2020-03-06 DIAGNOSIS — R05 Cough: Secondary | ICD-10-CM | POA: Diagnosis not present

## 2020-04-06 DIAGNOSIS — J189 Pneumonia, unspecified organism: Secondary | ICD-10-CM | POA: Diagnosis not present

## 2020-05-19 DIAGNOSIS — E1151 Type 2 diabetes mellitus with diabetic peripheral angiopathy without gangrene: Secondary | ICD-10-CM | POA: Diagnosis not present

## 2020-05-19 DIAGNOSIS — I129 Hypertensive chronic kidney disease with stage 1 through stage 4 chronic kidney disease, or unspecified chronic kidney disease: Secondary | ICD-10-CM | POA: Diagnosis not present

## 2020-05-19 DIAGNOSIS — I739 Peripheral vascular disease, unspecified: Secondary | ICD-10-CM | POA: Diagnosis not present

## 2020-05-19 DIAGNOSIS — I10 Essential (primary) hypertension: Secondary | ICD-10-CM | POA: Diagnosis not present

## 2020-05-19 DIAGNOSIS — E785 Hyperlipidemia, unspecified: Secondary | ICD-10-CM | POA: Diagnosis not present

## 2020-08-19 DIAGNOSIS — Z23 Encounter for immunization: Secondary | ICD-10-CM | POA: Diagnosis not present

## 2020-12-01 DIAGNOSIS — E785 Hyperlipidemia, unspecified: Secondary | ICD-10-CM | POA: Diagnosis not present

## 2020-12-01 DIAGNOSIS — E1121 Type 2 diabetes mellitus with diabetic nephropathy: Secondary | ICD-10-CM | POA: Diagnosis not present

## 2020-12-08 DIAGNOSIS — M1990 Unspecified osteoarthritis, unspecified site: Secondary | ICD-10-CM | POA: Diagnosis not present

## 2020-12-08 DIAGNOSIS — I129 Hypertensive chronic kidney disease with stage 1 through stage 4 chronic kidney disease, or unspecified chronic kidney disease: Secondary | ICD-10-CM | POA: Diagnosis not present

## 2020-12-08 DIAGNOSIS — E785 Hyperlipidemia, unspecified: Secondary | ICD-10-CM | POA: Diagnosis not present

## 2020-12-08 DIAGNOSIS — H6123 Impacted cerumen, bilateral: Secondary | ICD-10-CM | POA: Diagnosis not present

## 2020-12-08 DIAGNOSIS — Z0000 Encounter for general adult medical examination without abnormal findings: Secondary | ICD-10-CM | POA: Diagnosis not present

## 2020-12-08 DIAGNOSIS — E1151 Type 2 diabetes mellitus with diabetic peripheral angiopathy without gangrene: Secondary | ICD-10-CM | POA: Diagnosis not present

## 2020-12-08 DIAGNOSIS — Z1212 Encounter for screening for malignant neoplasm of rectum: Secondary | ICD-10-CM | POA: Diagnosis not present

## 2020-12-08 DIAGNOSIS — I739 Peripheral vascular disease, unspecified: Secondary | ICD-10-CM | POA: Diagnosis not present

## 2020-12-08 DIAGNOSIS — R82998 Other abnormal findings in urine: Secondary | ICD-10-CM | POA: Diagnosis not present

## 2021-04-12 DIAGNOSIS — E1151 Type 2 diabetes mellitus with diabetic peripheral angiopathy without gangrene: Secondary | ICD-10-CM | POA: Diagnosis not present

## 2021-04-12 DIAGNOSIS — E785 Hyperlipidemia, unspecified: Secondary | ICD-10-CM | POA: Diagnosis not present

## 2021-04-12 DIAGNOSIS — M1990 Unspecified osteoarthritis, unspecified site: Secondary | ICD-10-CM | POA: Diagnosis not present

## 2021-04-12 DIAGNOSIS — I129 Hypertensive chronic kidney disease with stage 1 through stage 4 chronic kidney disease, or unspecified chronic kidney disease: Secondary | ICD-10-CM | POA: Diagnosis not present

## 2021-04-12 DIAGNOSIS — I739 Peripheral vascular disease, unspecified: Secondary | ICD-10-CM | POA: Diagnosis not present

## 2021-05-19 DIAGNOSIS — E1151 Type 2 diabetes mellitus with diabetic peripheral angiopathy without gangrene: Secondary | ICD-10-CM | POA: Diagnosis not present

## 2021-05-19 DIAGNOSIS — J309 Allergic rhinitis, unspecified: Secondary | ICD-10-CM | POA: Diagnosis not present

## 2021-05-19 DIAGNOSIS — R071 Chest pain on breathing: Secondary | ICD-10-CM | POA: Diagnosis not present

## 2021-07-06 DIAGNOSIS — E119 Type 2 diabetes mellitus without complications: Secondary | ICD-10-CM | POA: Diagnosis not present

## 2021-07-06 DIAGNOSIS — H40033 Anatomical narrow angle, bilateral: Secondary | ICD-10-CM | POA: Diagnosis not present

## 2021-07-27 DIAGNOSIS — E785 Hyperlipidemia, unspecified: Secondary | ICD-10-CM | POA: Diagnosis not present

## 2021-07-27 DIAGNOSIS — I129 Hypertensive chronic kidney disease with stage 1 through stage 4 chronic kidney disease, or unspecified chronic kidney disease: Secondary | ICD-10-CM | POA: Diagnosis not present

## 2021-07-27 DIAGNOSIS — E1151 Type 2 diabetes mellitus with diabetic peripheral angiopathy without gangrene: Secondary | ICD-10-CM | POA: Diagnosis not present

## 2021-07-27 DIAGNOSIS — I739 Peripheral vascular disease, unspecified: Secondary | ICD-10-CM | POA: Diagnosis not present

## 2021-07-27 DIAGNOSIS — Z23 Encounter for immunization: Secondary | ICD-10-CM | POA: Diagnosis not present

## 2021-07-27 DIAGNOSIS — L989 Disorder of the skin and subcutaneous tissue, unspecified: Secondary | ICD-10-CM | POA: Diagnosis not present

## 2021-12-28 DIAGNOSIS — E785 Hyperlipidemia, unspecified: Secondary | ICD-10-CM | POA: Diagnosis not present

## 2021-12-28 DIAGNOSIS — I10 Essential (primary) hypertension: Secondary | ICD-10-CM | POA: Diagnosis not present

## 2021-12-28 DIAGNOSIS — E1121 Type 2 diabetes mellitus with diabetic nephropathy: Secondary | ICD-10-CM | POA: Diagnosis not present

## 2022-01-04 DIAGNOSIS — Z0000 Encounter for general adult medical examination without abnormal findings: Secondary | ICD-10-CM | POA: Diagnosis not present

## 2022-01-04 DIAGNOSIS — I129 Hypertensive chronic kidney disease with stage 1 through stage 4 chronic kidney disease, or unspecified chronic kidney disease: Secondary | ICD-10-CM | POA: Diagnosis not present

## 2022-01-04 DIAGNOSIS — E785 Hyperlipidemia, unspecified: Secondary | ICD-10-CM | POA: Diagnosis not present

## 2022-01-04 DIAGNOSIS — Z1212 Encounter for screening for malignant neoplasm of rectum: Secondary | ICD-10-CM | POA: Diagnosis not present

## 2022-01-04 DIAGNOSIS — R82998 Other abnormal findings in urine: Secondary | ICD-10-CM | POA: Diagnosis not present

## 2022-01-04 DIAGNOSIS — Z1339 Encounter for screening examination for other mental health and behavioral disorders: Secondary | ICD-10-CM | POA: Diagnosis not present

## 2022-01-04 DIAGNOSIS — I739 Peripheral vascular disease, unspecified: Secondary | ICD-10-CM | POA: Diagnosis not present

## 2022-01-04 DIAGNOSIS — I10 Essential (primary) hypertension: Secondary | ICD-10-CM | POA: Diagnosis not present

## 2022-01-04 DIAGNOSIS — Z1331 Encounter for screening for depression: Secondary | ICD-10-CM | POA: Diagnosis not present

## 2022-01-04 DIAGNOSIS — H6122 Impacted cerumen, left ear: Secondary | ICD-10-CM | POA: Diagnosis not present

## 2022-01-04 DIAGNOSIS — E1151 Type 2 diabetes mellitus with diabetic peripheral angiopathy without gangrene: Secondary | ICD-10-CM | POA: Diagnosis not present

## 2022-06-30 DIAGNOSIS — H903 Sensorineural hearing loss, bilateral: Secondary | ICD-10-CM | POA: Diagnosis not present

## 2022-07-05 DIAGNOSIS — I10 Essential (primary) hypertension: Secondary | ICD-10-CM | POA: Diagnosis not present

## 2022-07-05 DIAGNOSIS — E785 Hyperlipidemia, unspecified: Secondary | ICD-10-CM | POA: Diagnosis not present

## 2022-07-05 DIAGNOSIS — F5101 Primary insomnia: Secondary | ICD-10-CM | POA: Diagnosis not present

## 2022-07-05 DIAGNOSIS — I739 Peripheral vascular disease, unspecified: Secondary | ICD-10-CM | POA: Diagnosis not present

## 2022-07-05 DIAGNOSIS — E1151 Type 2 diabetes mellitus with diabetic peripheral angiopathy without gangrene: Secondary | ICD-10-CM | POA: Diagnosis not present

## 2022-07-05 DIAGNOSIS — G720 Drug-induced myopathy: Secondary | ICD-10-CM | POA: Diagnosis not present

## 2022-07-29 DIAGNOSIS — H903 Sensorineural hearing loss, bilateral: Secondary | ICD-10-CM | POA: Diagnosis not present

## 2022-08-30 DIAGNOSIS — H40033 Anatomical narrow angle, bilateral: Secondary | ICD-10-CM | POA: Diagnosis not present

## 2022-08-30 DIAGNOSIS — E119 Type 2 diabetes mellitus without complications: Secondary | ICD-10-CM | POA: Diagnosis not present

## 2022-11-17 DIAGNOSIS — F5101 Primary insomnia: Secondary | ICD-10-CM | POA: Diagnosis not present

## 2022-11-17 DIAGNOSIS — E1151 Type 2 diabetes mellitus with diabetic peripheral angiopathy without gangrene: Secondary | ICD-10-CM | POA: Diagnosis not present

## 2022-11-17 DIAGNOSIS — I739 Peripheral vascular disease, unspecified: Secondary | ICD-10-CM | POA: Diagnosis not present

## 2022-11-17 DIAGNOSIS — I10 Essential (primary) hypertension: Secondary | ICD-10-CM | POA: Diagnosis not present

## 2022-11-17 DIAGNOSIS — E785 Hyperlipidemia, unspecified: Secondary | ICD-10-CM | POA: Diagnosis not present

## 2022-11-17 DIAGNOSIS — E1121 Type 2 diabetes mellitus with diabetic nephropathy: Secondary | ICD-10-CM | POA: Diagnosis not present

## 2023-02-21 DIAGNOSIS — I10 Essential (primary) hypertension: Secondary | ICD-10-CM | POA: Diagnosis not present

## 2023-02-21 DIAGNOSIS — E1151 Type 2 diabetes mellitus with diabetic peripheral angiopathy without gangrene: Secondary | ICD-10-CM | POA: Diagnosis not present

## 2023-02-21 DIAGNOSIS — R7989 Other specified abnormal findings of blood chemistry: Secondary | ICD-10-CM | POA: Diagnosis not present

## 2023-02-21 DIAGNOSIS — E785 Hyperlipidemia, unspecified: Secondary | ICD-10-CM | POA: Diagnosis not present

## 2023-02-25 DIAGNOSIS — Z1212 Encounter for screening for malignant neoplasm of rectum: Secondary | ICD-10-CM | POA: Diagnosis not present

## 2023-02-28 DIAGNOSIS — E1121 Type 2 diabetes mellitus with diabetic nephropathy: Secondary | ICD-10-CM | POA: Diagnosis not present

## 2023-02-28 DIAGNOSIS — R82998 Other abnormal findings in urine: Secondary | ICD-10-CM | POA: Diagnosis not present

## 2023-02-28 DIAGNOSIS — E1129 Type 2 diabetes mellitus with other diabetic kidney complication: Secondary | ICD-10-CM | POA: Diagnosis not present

## 2023-02-28 DIAGNOSIS — E1151 Type 2 diabetes mellitus with diabetic peripheral angiopathy without gangrene: Secondary | ICD-10-CM | POA: Diagnosis not present

## 2023-07-25 DIAGNOSIS — E1151 Type 2 diabetes mellitus with diabetic peripheral angiopathy without gangrene: Secondary | ICD-10-CM | POA: Diagnosis not present

## 2023-07-25 DIAGNOSIS — I10 Essential (primary) hypertension: Secondary | ICD-10-CM | POA: Diagnosis not present

## 2023-07-25 DIAGNOSIS — E785 Hyperlipidemia, unspecified: Secondary | ICD-10-CM | POA: Diagnosis not present

## 2023-07-25 DIAGNOSIS — H6123 Impacted cerumen, bilateral: Secondary | ICD-10-CM | POA: Diagnosis not present

## 2023-07-25 DIAGNOSIS — I739 Peripheral vascular disease, unspecified: Secondary | ICD-10-CM | POA: Diagnosis not present

## 2023-08-23 DIAGNOSIS — H40033 Anatomical narrow angle, bilateral: Secondary | ICD-10-CM | POA: Diagnosis not present

## 2023-08-23 DIAGNOSIS — E119 Type 2 diabetes mellitus without complications: Secondary | ICD-10-CM | POA: Diagnosis not present

## 2024-03-01 DIAGNOSIS — I10 Essential (primary) hypertension: Secondary | ICD-10-CM | POA: Diagnosis not present

## 2024-03-01 DIAGNOSIS — E785 Hyperlipidemia, unspecified: Secondary | ICD-10-CM | POA: Diagnosis not present

## 2024-03-01 DIAGNOSIS — E1151 Type 2 diabetes mellitus with diabetic peripheral angiopathy without gangrene: Secondary | ICD-10-CM | POA: Diagnosis not present

## 2024-03-01 DIAGNOSIS — Z1212 Encounter for screening for malignant neoplasm of rectum: Secondary | ICD-10-CM | POA: Diagnosis not present

## 2024-03-08 DIAGNOSIS — I739 Peripheral vascular disease, unspecified: Secondary | ICD-10-CM | POA: Diagnosis not present

## 2024-03-08 DIAGNOSIS — Z6838 Body mass index (BMI) 38.0-38.9, adult: Secondary | ICD-10-CM | POA: Diagnosis not present

## 2024-03-08 DIAGNOSIS — F5101 Primary insomnia: Secondary | ICD-10-CM | POA: Diagnosis not present

## 2024-03-08 DIAGNOSIS — E785 Hyperlipidemia, unspecified: Secondary | ICD-10-CM | POA: Diagnosis not present

## 2024-03-08 DIAGNOSIS — I10 Essential (primary) hypertension: Secondary | ICD-10-CM | POA: Diagnosis not present

## 2024-03-08 DIAGNOSIS — Z0000 Encounter for general adult medical examination without abnormal findings: Secondary | ICD-10-CM | POA: Diagnosis not present

## 2024-03-08 DIAGNOSIS — E1151 Type 2 diabetes mellitus with diabetic peripheral angiopathy without gangrene: Secondary | ICD-10-CM | POA: Diagnosis not present

## 2024-07-10 DIAGNOSIS — H40033 Anatomical narrow angle, bilateral: Secondary | ICD-10-CM | POA: Diagnosis not present

## 2024-07-10 DIAGNOSIS — H2513 Age-related nuclear cataract, bilateral: Secondary | ICD-10-CM | POA: Diagnosis not present

## 2024-07-18 DIAGNOSIS — F5101 Primary insomnia: Secondary | ICD-10-CM | POA: Diagnosis not present

## 2024-07-18 DIAGNOSIS — E785 Hyperlipidemia, unspecified: Secondary | ICD-10-CM | POA: Diagnosis not present

## 2024-07-18 DIAGNOSIS — Z6838 Body mass index (BMI) 38.0-38.9, adult: Secondary | ICD-10-CM | POA: Diagnosis not present

## 2024-07-18 DIAGNOSIS — I739 Peripheral vascular disease, unspecified: Secondary | ICD-10-CM | POA: Diagnosis not present

## 2024-07-18 DIAGNOSIS — I10 Essential (primary) hypertension: Secondary | ICD-10-CM | POA: Diagnosis not present

## 2024-07-18 DIAGNOSIS — E1151 Type 2 diabetes mellitus with diabetic peripheral angiopathy without gangrene: Secondary | ICD-10-CM | POA: Diagnosis not present

## 2024-07-23 DIAGNOSIS — H3554 Dystrophies primarily involving the retinal pigment epithelium: Secondary | ICD-10-CM | POA: Diagnosis not present

## 2024-07-23 DIAGNOSIS — H2513 Age-related nuclear cataract, bilateral: Secondary | ICD-10-CM | POA: Diagnosis not present

## 2024-07-23 DIAGNOSIS — H2511 Age-related nuclear cataract, right eye: Secondary | ICD-10-CM | POA: Diagnosis not present

## 2024-07-23 DIAGNOSIS — H25043 Posterior subcapsular polar age-related cataract, bilateral: Secondary | ICD-10-CM | POA: Diagnosis not present

## 2024-07-23 DIAGNOSIS — H25013 Cortical age-related cataract, bilateral: Secondary | ICD-10-CM | POA: Diagnosis not present

## 2024-07-23 DIAGNOSIS — H18413 Arcus senilis, bilateral: Secondary | ICD-10-CM | POA: Diagnosis not present

## 2024-07-24 DIAGNOSIS — H43813 Vitreous degeneration, bilateral: Secondary | ICD-10-CM | POA: Diagnosis not present

## 2024-07-24 DIAGNOSIS — H2513 Age-related nuclear cataract, bilateral: Secondary | ICD-10-CM | POA: Diagnosis not present

## 2024-07-24 DIAGNOSIS — H3554 Dystrophies primarily involving the retinal pigment epithelium: Secondary | ICD-10-CM | POA: Diagnosis not present

## 2024-09-30 DIAGNOSIS — H5371 Glare sensitivity: Secondary | ICD-10-CM | POA: Diagnosis not present

## 2024-09-30 DIAGNOSIS — H2511 Age-related nuclear cataract, right eye: Secondary | ICD-10-CM | POA: Diagnosis not present

## 2024-10-01 DIAGNOSIS — H25012 Cortical age-related cataract, left eye: Secondary | ICD-10-CM | POA: Diagnosis not present

## 2024-10-01 DIAGNOSIS — H2512 Age-related nuclear cataract, left eye: Secondary | ICD-10-CM | POA: Diagnosis not present

## 2024-10-01 DIAGNOSIS — H25042 Posterior subcapsular polar age-related cataract, left eye: Secondary | ICD-10-CM | POA: Diagnosis not present

## 2024-10-11 NOTE — Progress Notes (Signed)
 MAKENZE ELLETT                                          MRN: [id number]   10/11/2024   The [facility] Quality Team Specialist reviewed this patient medical record for the purposes of chart review for care gap closure. The following were reviewed: chart review for care gap closure-kidney health evaluation for diabetes:eGFR  and uACR.    [facility] Quality Team
